# Patient Record
Sex: Female | Born: 1970 | Race: White | Hispanic: No | Marital: Married | Smoking: Never smoker
Health system: Southern US, Community
[De-identification: ages and names within clinical notes are randomized; demographics above are authoritative.]

## PROBLEM LIST (undated history)

## (undated) DIAGNOSIS — F32A Depression, unspecified: Secondary | ICD-10-CM

## (undated) DIAGNOSIS — E785 Hyperlipidemia, unspecified: Secondary | ICD-10-CM

## (undated) DIAGNOSIS — R7303 Prediabetes: Secondary | ICD-10-CM

## (undated) DIAGNOSIS — E049 Nontoxic goiter, unspecified: Secondary | ICD-10-CM

## (undated) DIAGNOSIS — T8859XA Other complications of anesthesia, initial encounter: Secondary | ICD-10-CM

## (undated) DIAGNOSIS — K219 Gastro-esophageal reflux disease without esophagitis: Secondary | ICD-10-CM

## (undated) DIAGNOSIS — C801 Malignant (primary) neoplasm, unspecified: Secondary | ICD-10-CM

## (undated) DIAGNOSIS — F329 Major depressive disorder, single episode, unspecified: Secondary | ICD-10-CM

## (undated) DIAGNOSIS — F419 Anxiety disorder, unspecified: Secondary | ICD-10-CM

## (undated) DIAGNOSIS — I10 Essential (primary) hypertension: Secondary | ICD-10-CM

## (undated) DIAGNOSIS — L570 Actinic keratosis: Secondary | ICD-10-CM

## (undated) DIAGNOSIS — R011 Cardiac murmur, unspecified: Secondary | ICD-10-CM

## (undated) HISTORY — DX: Essential (primary) hypertension: I10

## (undated) HISTORY — PX: COLONOSCOPY: SHX174

## (undated) HISTORY — PX: BREAST CYST ASPIRATION: SHX578

## (undated) HISTORY — PX: TONSILLECTOMY: SUR1361

## (undated) HISTORY — DX: Gastro-esophageal reflux disease without esophagitis: K21.9

## (undated) HISTORY — DX: Depression, unspecified: F32.A

## (undated) HISTORY — DX: Prediabetes: R73.03

## (undated) HISTORY — DX: Actinic keratosis: L57.0

## (undated) HISTORY — PX: NASAL SEPTUM SURGERY: SHX37

## (undated) HISTORY — DX: Major depressive disorder, single episode, unspecified: F32.9

## (undated) HISTORY — DX: Nontoxic goiter, unspecified: E04.9

## (undated) HISTORY — DX: Hyperlipidemia, unspecified: E78.5

## (undated) HISTORY — DX: Anxiety disorder, unspecified: F41.9

## (undated) HISTORY — PX: TONSILLECTOMY: SHX5217

---

## 2005-11-01 ENCOUNTER — Ambulatory Visit: Payer: Self-pay | Admitting: Family Medicine

## 2011-05-02 ENCOUNTER — Ambulatory Visit: Payer: Self-pay | Admitting: Obstetrics and Gynecology

## 2012-05-08 ENCOUNTER — Ambulatory Visit: Payer: Self-pay | Admitting: Obstetrics and Gynecology

## 2013-01-28 ENCOUNTER — Ambulatory Visit: Payer: Self-pay | Admitting: Family Medicine

## 2013-10-29 ENCOUNTER — Ambulatory Visit: Payer: Self-pay

## 2014-02-09 ENCOUNTER — Ambulatory Visit: Payer: Self-pay | Admitting: Family Medicine

## 2015-11-08 ENCOUNTER — Other Ambulatory Visit: Payer: Self-pay | Admitting: Obstetrics and Gynecology

## 2015-11-08 DIAGNOSIS — Z1239 Encounter for other screening for malignant neoplasm of breast: Secondary | ICD-10-CM

## 2016-03-13 ENCOUNTER — Other Ambulatory Visit: Payer: Self-pay | Admitting: Otolaryngology

## 2016-03-13 DIAGNOSIS — E041 Nontoxic single thyroid nodule: Secondary | ICD-10-CM

## 2016-03-15 ENCOUNTER — Ambulatory Visit: Payer: Self-pay

## 2016-06-11 ENCOUNTER — Encounter: Payer: Self-pay | Admitting: Family Medicine

## 2016-06-29 HISTORY — PX: ABDOMINAL HYSTERECTOMY: SHX81

## 2016-07-04 DIAGNOSIS — K219 Gastro-esophageal reflux disease without esophagitis: Secondary | ICD-10-CM | POA: Insufficient documentation

## 2016-07-04 DIAGNOSIS — E785 Hyperlipidemia, unspecified: Secondary | ICD-10-CM | POA: Insufficient documentation

## 2016-07-04 DIAGNOSIS — R7303 Prediabetes: Secondary | ICD-10-CM | POA: Insufficient documentation

## 2016-07-04 DIAGNOSIS — L570 Actinic keratosis: Secondary | ICD-10-CM | POA: Insufficient documentation

## 2016-07-04 DIAGNOSIS — F419 Anxiety disorder, unspecified: Secondary | ICD-10-CM

## 2016-07-04 DIAGNOSIS — F32A Depression, unspecified: Secondary | ICD-10-CM | POA: Insufficient documentation

## 2016-07-04 DIAGNOSIS — E049 Nontoxic goiter, unspecified: Secondary | ICD-10-CM

## 2016-07-04 DIAGNOSIS — I10 Essential (primary) hypertension: Secondary | ICD-10-CM | POA: Insufficient documentation

## 2016-07-04 DIAGNOSIS — F329 Major depressive disorder, single episode, unspecified: Secondary | ICD-10-CM

## 2016-07-04 DIAGNOSIS — E782 Mixed hyperlipidemia: Secondary | ICD-10-CM

## 2016-07-20 ENCOUNTER — Ambulatory Visit: Payer: Self-pay | Admitting: Family Medicine

## 2016-08-29 ENCOUNTER — Encounter: Payer: Self-pay | Admitting: Family Medicine

## 2016-08-29 ENCOUNTER — Ambulatory Visit (INDEPENDENT_AMBULATORY_CARE_PROVIDER_SITE_OTHER): Payer: BC Managed Care – PPO | Admitting: Family Medicine

## 2016-08-29 VITALS — BP 134/77 | HR 107 | Temp 99.6°F | Ht 63.75 in | Wt 228.4 lb

## 2016-08-29 DIAGNOSIS — F330 Major depressive disorder, recurrent, mild: Secondary | ICD-10-CM | POA: Diagnosis not present

## 2016-08-29 DIAGNOSIS — Z7689 Persons encountering health services in other specified circumstances: Secondary | ICD-10-CM | POA: Diagnosis not present

## 2016-08-29 DIAGNOSIS — F419 Anxiety disorder, unspecified: Secondary | ICD-10-CM

## 2016-08-29 DIAGNOSIS — I10 Essential (primary) hypertension: Secondary | ICD-10-CM

## 2016-08-29 DIAGNOSIS — R509 Fever, unspecified: Secondary | ICD-10-CM | POA: Diagnosis not present

## 2016-08-29 LAB — VERITOR FLU A/B WAIVED
INFLUENZA A: NEGATIVE
Influenza B: NEGATIVE

## 2016-08-29 MED ORDER — ESCITALOPRAM OXALATE 20 MG PO TABS: 20.0000 mg | ORAL_TABLET | Freq: Every day | ORAL | 3 refills | Status: DC

## 2016-08-29 MED ORDER — BUSPIRONE HCL 15 MG PO TABS: 15.0000 mg | ORAL_TABLET | Freq: Every day | ORAL | 3 refills | Status: DC

## 2016-08-29 MED ORDER — HYDROCHLOROTHIAZIDE 25 MG PO TABS: 25.0000 mg | ORAL_TABLET | Freq: Every day | ORAL | 1 refills | Status: DC

## 2016-08-29 NOTE — Assessment & Plan Note (Signed)
Stable on lexapro. Continue current regimen

## 2016-08-29 NOTE — Progress Notes (Addendum)
BP 134/77 (BP Location: Left Arm, Patient Position: Sitting, Cuff Size: Normal)   Pulse (!) 107   Temp 99.6 F (37.6 C)   Ht 5' 3.75" (1.619 m)   Wt 228 lb 6.4 oz (103.6 kg)   SpO2 97%   BMI 39.51 kg/m    Subjective:    Patient ID: Paula Clay, female    DOB: 20-Jun-1971, 46 y.o.   MRN: [id number]  HPI: Paula Clay is a 46 y.o. female  Chief Complaint  Patient presents with  . Establish Care   Patient presents to establish care. Is noted to have a low grade fever upon check in, and states she is developing a sore throat and some aches. Has not taken anything for sxs as they have just started coming on in the past hour or so. She is a Education officer, museum, so several sick contacts.   Moods are stable on current regimen of lexapro and buspar. Has tapered back to once daily buspar and seems to be fine on that. Eventually wanting to come off altogether, but states she wants to wait until summer to discuss that further.   BPs doing well with HCTZ. Has had some mild potassium abnormalities in the past so gets electrolytes monitored regularly. Denies CP, SOB, dizziness, syncope, HAs. Taking medication faithfully without side effects.   Past Medical History:  Diagnosis Date  . Actinic keratosis   . Anxiety   . Depression   . GERD (gastroesophageal reflux disease)   . Hyperlipidemia   . Hypertension   . Pre-diabetes   . Thyroid goiter    Social History   Social History  . Marital status: Married    Spouse name: N/A  . Number of children: N/A  . Years of education: N/A   Occupational History  . Not on file.   Social History Main Topics  . Smoking status: Never Smoker  . Smokeless tobacco: Never Used  . Alcohol use No     Comment: Very Rarely  . Drug use: No  . Sexual activity: No   Other Topics Concern  . Not on file   Social History Narrative  . No narrative on file   Depression screen [facility] 2/9 08/29/2016  Decreased Interest 0  Down, Depressed, Hopeless  0  PHQ - 2 Score 0  Altered sleeping 0  Tired, decreased energy 1  Change in appetite 1  Feeling bad or failure about yourself  1  Trouble concentrating 0  Moving slowly or fidgety/restless 0  Suicidal thoughts 0  PHQ-9 Score 3   GAD 7 : Generalized Anxiety Score 08/29/2016  Nervous, Anxious, on Edge 1  Control/stop worrying 0  Worry too much - different things 0  Trouble relaxing 0  Restless 0  Easily annoyed or irritable 1  Afraid - awful might happen 0  Total GAD 7 Score 2  Anxiety Difficulty Not difficult at all     Relevant past medical, surgical, family and social history reviewed and updated as indicated. Interim medical history since our last visit reviewed. Allergies and medications reviewed and updated.  Review of Systems  Constitutional: Positive for diaphoresis and fever.  HENT: Positive for sore throat.   Eyes: Negative.   Respiratory: Negative.   Cardiovascular: Negative.   Gastrointestinal: Negative.   Genitourinary: Negative.   Musculoskeletal: Positive for myalgias.  Neurological: Negative.   Psychiatric/Behavioral: Negative.     Per HPI unless specifically indicated above     Objective:    BP 134/77 (  BP Location: Left Arm, Patient Position: Sitting, Cuff Size: Normal)   Pulse (!) 107   Temp 99.6 F (37.6 C)   Ht 5' 3.75" (1.619 m)   Wt 228 lb 6.4 oz (103.6 kg)   SpO2 97%   BMI 39.51 kg/m   Wt Readings from Last 3 Encounters:  08/29/16 228 lb 6.4 oz (103.6 kg)    Physical Exam  Constitutional: She is oriented to person, place, and time. She appears well-developed and well-nourished. No distress.  HENT:  Head: Atraumatic.  Eyes: Conjunctivae are normal. Pupils are equal, round, and reactive to light. No scleral icterus.  Neck: Normal range of motion. Neck supple. No thyromegaly present.  Cardiovascular: Normal rate and normal heart sounds.   Pulmonary/Chest: Effort normal and breath sounds normal. No respiratory distress.    Musculoskeletal: Normal range of motion.  Neurological: She is alert and oriented to person, place, and time.  Skin: Skin is warm and dry.  Psychiatric: She has a normal mood and affect. Her behavior is normal.  Nursing note and vitals reviewed.   No results found for this or any previous visit.    Assessment & Plan:   Problem List Items Addressed This Visit      Cardiovascular and Mediastinum   Hypertension    Stable on 25 mg HCTZ. Check BMP today, follow up in 6 months.       Relevant Medications   hydrochlorothiazide (HYDRODIURIL) 25 MG tablet   Other Relevant Orders   Basic Metabolic Panel (BMET)     Other   Anxiety    Stable, has been able to cut back to 15 mg buspar once daily. Interested in eventually coming off altogether but for now will continue this regimen      Relevant Medications   escitalopram (LEXAPRO) 20 MG tablet   busPIRone (BUSPAR) 15 MG tablet   Depression    Stable on lexapro. Continue current regimen      Relevant Medications   escitalopram (LEXAPRO) 20 MG tablet   busPIRone (BUSPAR) 15 MG tablet    Other Visit Diagnoses    Encounter to establish care    -  Primary   Fever, unspecified fever cause       Rapid flu negative. Discussed with patient to follow up tomorrow by phone or mychart if symptoms persist or worsen. Tylenol and ibuprofen prn   Relevant Orders   Veritor Flu A/B Waived       Follow up plan: Return in about 6 months (around 02/26/2017) for Physical Exam.

## 2016-08-29 NOTE — Assessment & Plan Note (Signed)
Stable on 25 mg HCTZ. Check BMP today, follow up in 6 months.

## 2016-08-29 NOTE — Patient Instructions (Signed)
Follow up in about 6 months for your Physical exam

## 2016-08-29 NOTE — Assessment & Plan Note (Signed)
Stable, has been able to cut back to 15 mg buspar once daily. Interested in eventually coming off altogether but for now will continue this regimen

## 2016-08-30 LAB — BASIC METABOLIC PANEL
BUN/Creatinine Ratio: 22 (ref 9–23)
BUN: 17 mg/dL (ref 6–24)
CHLORIDE: 97 mmol/L (ref 96–106)
CO2: 22 mmol/L (ref 18–29)
Calcium: 9.4 mg/dL (ref 8.7–10.2)
Creatinine, Ser: 0.78 mg/dL (ref 0.57–1.00)
GFR calc Af Amer: 105 mL/min/{1.73_m2} (ref >59)
GFR, EST NON AFRICAN AMERICAN: 91 mL/min/{1.73_m2} (ref >59)
GLUCOSE: 97 mg/dL (ref 65–99)
POTASSIUM: 4 mmol/L (ref 3.5–5.2)
SODIUM: 140 mmol/L (ref 134–144)

## 2016-08-31 ENCOUNTER — Encounter: Payer: Self-pay | Admitting: Family Medicine

## 2016-11-16 ENCOUNTER — Ambulatory Visit (INDEPENDENT_AMBULATORY_CARE_PROVIDER_SITE_OTHER): Payer: BC Managed Care – PPO | Admitting: Family Medicine

## 2016-11-16 ENCOUNTER — Encounter: Payer: Self-pay | Admitting: Family Medicine

## 2016-11-16 ENCOUNTER — Telehealth: Payer: Self-pay | Admitting: Family Medicine

## 2016-11-16 VITALS — BP 113/79 | HR 86 | Temp 98.4°F | Wt 218.6 lb

## 2016-11-16 DIAGNOSIS — M62838 Other muscle spasm: Secondary | ICD-10-CM | POA: Diagnosis not present

## 2016-11-16 DIAGNOSIS — R079 Chest pain, unspecified: Secondary | ICD-10-CM | POA: Diagnosis not present

## 2016-11-16 DIAGNOSIS — H93A1 Pulsatile tinnitus, right ear: Secondary | ICD-10-CM | POA: Diagnosis not present

## 2016-11-16 DIAGNOSIS — R202 Paresthesia of skin: Secondary | ICD-10-CM

## 2016-11-16 MED ORDER — NAPROXEN 500 MG PO TABS: 500.0000 mg | ORAL_TABLET | Freq: Two times a day (BID) | ORAL | 0 refills | Status: DC

## 2016-11-16 MED ORDER — CYCLOBENZAPRINE HCL 10 MG PO TABS: 10.0000 mg | ORAL_TABLET | Freq: Every day | ORAL | 0 refills | Status: DC

## 2016-11-16 NOTE — Telephone Encounter (Signed)
Appointment scheduled.

## 2016-11-16 NOTE — Telephone Encounter (Signed)
Does she need to go to the ER

## 2016-11-16 NOTE — Telephone Encounter (Signed)
Pt called and stated she hears her heartbeat in R ear, L breast feels as if its vibrating, neck pain on R side at the base of her neck and her fingers on R hand are tingling and she her head hurts on the R side. She would like know what she should do.

## 2016-11-16 NOTE — Progress Notes (Signed)
BP 113/79 (BP Location: Right Arm, Patient Position: Sitting, Cuff Size: Large)   Pulse 86   Temp 98.4 F (36.9 C)   Wt 218 lb 9.6 oz (99.2 kg)   SpO2 96%   BMI 37.82 kg/m    Subjective:    Patient ID: Paula Clay, female    DOB: 1971/02/23, 46 y.o.   MRN: [id number]  HPI: Paula Clay is a 46 y.o. female  Chief Complaint  Patient presents with  . Chest Pain   She notes that her neck has been bothering her for several months- but has now gotten to the point that it was painful. Has a headache in the back of her head and her hands have been tingling. She notes that she can hear her heart beat in her R ear.  CHEST DISCOMFORT- vibrating of L breast, started 2 days ago, but hasn't happened today, not painful Time since onset: 3 days ago Duration: Came and went, lasted a couple of seconds, about every 20 minutes for 2 days straight Onset: sudden Quality: vibrating Severity: unsure how to rate it Location: In the L breast tissue Radiation: none Episode duration: couple of seconds Frequency: intermittent Related to exertion: no Activity when pain started: unknown Trauma: no Anxiety/recent stressors: no Aggravating factors: nothing Alleviating factors: nothing Status: better Treatments attempted: nothing  Current pain status: pain free Shortness of breath: no Cough: no Nausea: no Diaphoresis: yes- night sweats Heartburn: no Palpitations: no  NECK PAIN  Status: uncontrolled Treatments attempted: nothing   Relief with NSAIDs?:  No NSAIDs Taken Location:Right Duration:Several months, signifcantly worse for the last month or so Severity: severe Quality: sharp, stabbing Frequency: constant Radiation: headache Aggravating factors: looking towards it and looking down Alleviating factors: nothing Weakness:  no Paresthesias / decreased sensation:  yes  Fevers:  no   Relevant past medical, surgical, family and social history reviewed and updated as  indicated. Interim medical history since our last visit reviewed. Allergies and medications reviewed and updated.  Review of Systems  Constitutional: Negative.   HENT: Negative.   Respiratory: Negative.   Cardiovascular: Negative.   Musculoskeletal: Positive for myalgias, neck pain and neck stiffness. Negative for arthralgias, back pain, gait problem and joint swelling.  Neurological: Positive for numbness. Negative for dizziness, tremors, seizures, syncope, facial asymmetry, speech difficulty, weakness, light-headedness and headaches.  Psychiatric/Behavioral: Negative.     Per HPI unless specifically indicated above     Objective:    BP 113/79 (BP Location: Right Arm, Patient Position: Sitting, Cuff Size: Large)   Pulse 86   Temp 98.4 F (36.9 C)   Wt 218 lb 9.6 oz (99.2 kg)   SpO2 96%   BMI 37.82 kg/m   Wt Readings from Last 3 Encounters:  11/16/16 218 lb 9.6 oz (99.2 kg)  08/29/16 228 lb 6.4 oz (103.6 kg)    Orthostatic VS for the past 24 hrs:  BP- Lying Pulse- Lying BP- Sitting Pulse- Sitting BP- Standing at 0 minutes Pulse- Standing at 0 minutes  11/16/16 1447 105/69 71 112/76 80 127/85 96   Physical Exam  Constitutional: She is oriented to person, place, and time. She appears well-developed and well-nourished. No distress.  HENT:  Head: Normocephalic and atraumatic.  Right Ear: Hearing normal.  Left Ear: Hearing normal.  Nose: Nose normal.  Eyes: Conjunctivae and lids are normal. Right eye exhibits no discharge. Left eye exhibits no discharge. No scleral icterus.  Neck: Neck supple. No JVD present. No tracheal deviation present.  No thyromegaly present.  No bruits bilaterally  Cardiovascular: Normal rate, regular rhythm, normal heart sounds and intact distal pulses.  Exam reveals no friction rub.   No murmur heard. Pulmonary/Chest: Effort normal and breath sounds normal. No respiratory distress. She has no wheezes. She has no rales. She exhibits no tenderness.    Lymphadenopathy:    She has no cervical adenopathy.  Neurological: She is alert and oriented to person, place, and time.  Skin: Skin is warm, dry and intact. No rash noted. She is not diaphoretic. No erythema. No pallor.  Psychiatric: She has a normal mood and affect. Her speech is normal and behavior is normal. Judgment and thought content normal. Cognition and memory are normal.  Nursing note and vitals reviewed. Neck Exam:    Tenderness to Palpation: yes    Midline cervical spine: no    Paraspinal neck musculature: yes    Trapezius: yes    Sternocleidomastoid: yes     Range of Motion:     Flexion: Decreased    Extension: Decreased    Lateral rotation: Decreased    Lateral bending: Decreased     Neuro Examination: Upper extremity DTRs normal & symmetric.  Strength and sensation intact.    Special Tests:     Spurling test: negative   Results for orders placed or performed in visit on 08/29/16  Veritor Flu A/B Waived  Result Value Ref Range   Influenza A Negative Negative   Influenza B Negative Negative  Basic Metabolic Panel (BMET)  Result Value Ref Range   Glucose 97 65 - 99 mg/dL   BUN 17 6 - 24 mg/dL   Creatinine, Ser 0.78 0.57 - 1.00 mg/dL   GFR calc non Af Amer 91 >59 mL/min/1.73   GFR calc Af Amer 105 >59 mL/min/1.73   BUN/Creatinine Ratio 22 9 - 23   Sodium 140 134 - 144 mmol/L   Potassium 4.0 3.5 - 5.2 mmol/L   Chloride 97 96 - 106 mmol/L   CO2 22 18 - 29 mmol/L   Calcium 9.4 8.7 - 10.2 mg/dL      Assessment & Plan:   Problem List Items Addressed This Visit    None    Visit Diagnoses    Chest pain, unspecified type    -  Primary   Sounds more like breast discomfort, some flipped t-waves, will compare to previous EKG, offered referral to cardiology for stress test she will hold off for now   Relevant Orders   EKG 12-Lead (Completed)   Pulsatile tinnitus of right ear       No bruits. ?Due to SCM spasm. BP and orthostatics normal. Will start flexeril and  follow up on Monday by phone.    Muscle spasms of neck       Significant spasm and tenderness. Will start muscle relaxer and naproxen. Stretches given. Follow up on Monday by phone.    Paresthesias       Likely due to nerve irritation. Will treat muscle spasm and recheck by phone on Monday.       Follow up plan: Return By Phone On Monday, for Records release for [facility] for EKG please.

## 2016-11-16 NOTE — Telephone Encounter (Signed)
If we have anything or Paula Clay does she can come in (she likely needs her BP checked and and EKG) but if we don't, or she doesn't want to wait, she should go to the ER

## 2016-11-19 ENCOUNTER — Telehealth: Payer: Self-pay | Admitting: Family Medicine

## 2016-11-19 DIAGNOSIS — R9431 Abnormal electrocardiogram [ECG] [EKG]: Secondary | ICD-10-CM

## 2016-11-19 DIAGNOSIS — R42 Dizziness and giddiness: Secondary | ICD-10-CM

## 2016-11-19 DIAGNOSIS — R079 Chest pain, unspecified: Secondary | ICD-10-CM

## 2016-11-19 NOTE — Telephone Encounter (Signed)
Sent to [facility].

## 2016-11-19 NOTE — Telephone Encounter (Signed)
-----   Message from Valerie Roys, DO sent at 11/16/2016  3:33 PM EDT ----- Call about symptoms

## 2016-11-19 NOTE — Telephone Encounter (Signed)
She notes that she is still having the pulsatile tinnitus in her ear, has continued with the paresthesias in her hands. Has continued with the "vibrating" of her L breast and now is starting to feel dizzy, like she is going to pass out. Naproxen not particularly helping. Flexeril did seem to help a bit.  Still distinctly possible that this is due to the hypertonic muscles in her neck, however, we have not gotten the EKG tracing from [facility] yet to compare, and now with the dizzy spells, we will get her into cardiology ASAP for evaluation. She may need a stress test. Urgent referral generated today.   Glenetta Hew-- Urgent referral.

## 2016-11-20 ENCOUNTER — Telehealth: Payer: Self-pay

## 2016-11-20 NOTE — Telephone Encounter (Signed)
Patient offered new patient appt on 11/20/16 and 12/15/16 . She could not take Thursday or morning .  Scheduled patient for 5/10 at 220

## 2016-12-11 ENCOUNTER — Ambulatory Visit: Payer: Self-pay | Admitting: Cardiology

## 2017-01-22 ENCOUNTER — Other Ambulatory Visit: Payer: Self-pay | Admitting: Family Medicine

## 2017-01-22 DIAGNOSIS — Z1231 Encounter for screening mammogram for malignant neoplasm of breast: Secondary | ICD-10-CM

## 2017-02-05 NOTE — Telephone Encounter (Signed)
4 attempts made to schedule new patient appt per referral.  Called patient today no ans no vm.  Mailed letter .  Removed from wq.

## 2017-02-13 ENCOUNTER — Ambulatory Visit: Payer: Self-pay

## 2017-02-14 ENCOUNTER — Ambulatory Visit
Admission: RE | Admit: 2017-02-14 | Discharge: 2017-02-14 | Disposition: A | Discharge status: Home / Self Care | Payer: BC Managed Care – PPO | Source: Ambulatory Visit | Attending: Family Medicine | Admitting: Family Medicine

## 2017-02-14 DIAGNOSIS — Z1231 Encounter for screening mammogram for malignant neoplasm of breast: Secondary | ICD-10-CM

## 2017-02-14 HISTORY — DX: Malignant (primary) neoplasm, unspecified: C80.1

## 2017-02-18 ENCOUNTER — Ambulatory Visit (INDEPENDENT_AMBULATORY_CARE_PROVIDER_SITE_OTHER): Payer: BC Managed Care – PPO | Admitting: Family Medicine

## 2017-02-18 ENCOUNTER — Encounter: Payer: Self-pay | Admitting: Family Medicine

## 2017-02-18 VITALS — BP 98/65 | HR 81 | Temp 98.4°F | Ht 63.0 in | Wt 207.0 lb

## 2017-02-18 DIAGNOSIS — R7303 Prediabetes: Secondary | ICD-10-CM

## 2017-02-18 DIAGNOSIS — F330 Major depressive disorder, recurrent, mild: Secondary | ICD-10-CM

## 2017-02-18 DIAGNOSIS — I10 Essential (primary) hypertension: Secondary | ICD-10-CM | POA: Diagnosis not present

## 2017-02-18 DIAGNOSIS — M25552 Pain in left hip: Secondary | ICD-10-CM

## 2017-02-18 DIAGNOSIS — K219 Gastro-esophageal reflux disease without esophagitis: Secondary | ICD-10-CM | POA: Diagnosis not present

## 2017-02-18 DIAGNOSIS — Z0000 Encounter for general adult medical examination without abnormal findings: Secondary | ICD-10-CM | POA: Diagnosis not present

## 2017-02-18 DIAGNOSIS — E049 Nontoxic goiter, unspecified: Secondary | ICD-10-CM

## 2017-02-18 DIAGNOSIS — F419 Anxiety disorder, unspecified: Secondary | ICD-10-CM

## 2017-02-18 DIAGNOSIS — E782 Mixed hyperlipidemia: Secondary | ICD-10-CM | POA: Diagnosis not present

## 2017-02-18 DIAGNOSIS — M25551 Pain in right hip: Secondary | ICD-10-CM

## 2017-02-18 MED ORDER — ESCITALOPRAM OXALATE 20 MG PO TABS: 20.0000 mg | ORAL_TABLET | Freq: Every day | ORAL | 1 refills | Status: DC

## 2017-02-18 MED ORDER — BUSPIRONE HCL 15 MG PO TABS: 15.0000 mg | ORAL_TABLET | Freq: Every day | ORAL | 1 refills | Status: DC

## 2017-02-18 NOTE — Assessment & Plan Note (Signed)
Stable. She has not noticed any change in size. Will check labs today. Continue to monitor. Call with any concerns.

## 2017-02-18 NOTE — Assessment & Plan Note (Signed)
Running low with weight loss. Will stop HCTZ and recheck in 4-6 weeks. Call with any concerns.

## 2017-02-18 NOTE — Assessment & Plan Note (Signed)
Doing well. Will hold on cutting down right now due to father's illness. Once things stablize, will start cutting down.

## 2017-02-18 NOTE — Progress Notes (Signed)
BP 98/65 (BP Location: Left Arm, Patient Position: Sitting, Cuff Size: Large)   Pulse 81   Temp 98.4 F (36.9 C)   Ht 5\' 3"  (1.6 m)   Wt 207 lb (93.9 kg)   SpO2 94%   BMI 36.67 kg/m    Subjective:    Patient ID: Paula Clay, female    DOB: [birth year], 46 y.o.   MRN: [id number]  HPI: Paula Clay is a 46 y.o. female presenting on 02/18/2017 for comprehensive medical examination. Current medical complaints include:  HYPERTENSION / HYPERLIPIDEMIA Satisfied with current treatment? no Duration of hypertension: chronic BP monitoring frequency: not checking BP medication side effects: yes- has been getting  Past BP meds: HCTZ Duration of hyperlipidemia: chronic Cholesterol medication side effects: Not on anything Cholesterol supplements: none Past cholesterol medications: none Medication compliance: excellent compliance Aspirin: no Recent stressors: no Recurrent headaches: no Visual changes: no Palpitations: no Dyspnea: no Chest pain: no Lower extremity edema: no Dizzy/lightheaded: yes  Impaired Fasting Glucose Duration of elevated blood sugar: chronic Polydipsia: no Polyuria: no Weight change: yes Visual disturbance: no Glucose Monitoring: yes    Accucheck frequency: Not Checking   DEPRESSION- dad has been very sick, currently being treated for bladder cancer and being worked up for brain tumor Mood status: stable Satisfied with current treatment?: yes Symptom severity: mild  Duration of current treatment : chronic Side effects: no Medication compliance: excellent compliance Psychotherapy/counseling: no  Previous psychiatric medications: effexor, buspar Depressed mood: no Anxious mood: yes Anhedonia: no Significant weight loss or gain: yes- with effort Insomnia: no  Fatigue: yes Feelings of worthlessness or guilt: no Impaired concentration/indecisiveness: no Suicidal ideations: no Hopelessness: no Crying spells: no Depression screen [facility] 2/9  02/18/2017 08/29/2016  Decreased Interest 0 0  Down, Depressed, Hopeless 1 0  PHQ - 2 Score 1 0  Altered sleeping 0 0  Tired, decreased energy 1 1  Change in appetite 0 1  Feeling bad or failure about yourself  0 1  Trouble concentrating 0 0  Moving slowly or fidgety/restless 0 0  Suicidal thoughts 0 0  PHQ-9 Score 2 3   GAD 7 : Generalized Anxiety Score 02/18/2017 08/29/2016  Nervous, Anxious, on Edge 1 1  Control/stop worrying 0 0  Worry too much - different things 0 0  Trouble relaxing - 0  Restless 0 0  Easily annoyed or irritable 1 1  Afraid - awful might happen 1 0  Total GAD 7 Score - 2  Anxiety Difficulty Not difficult at all Not difficult at all   GERD GERD control status: controlled  Satisfied with current treatment? yes Heartburn frequency: none Dysphagia: no Odynophagia:  no Hematemesis: no Blood in stool: no EGD: no  GOITER Thyroid control status:controlled Satisfied with current treatment? yes Medication side effects: Not on anything Fatigue: no Cold intolerance: no Heat intolerance: no Weight gain: no Weight loss: no Constipation: yes Diarrhea/loose stools: no Palpitations: no Lower extremity edema: no Anxiety/depressed mood: no  Menopausal Symptoms: yes- starting with hot flashes  Depression Screen done today and results listed below:  Depression screen [name] 2/9 02/18/2017 08/29/2016  Decreased Interest 0 0  Down, Depressed, Hopeless 1 0  PHQ - 2 Score 1 0  Altered sleeping 0 0  Tired, decreased energy 1 1  Change in appetite 0 1  Feeling bad or failure about yourself  0 1  Trouble concentrating 0 0  Moving slowly or fidgety/restless 0 0  Suicidal thoughts 0 0  PHQ-9 Score  2 3    Past Medical History:  Past Medical History:  Diagnosis Date  . Actinic keratosis   . Anxiety   . Cancer ([place])    skin ca  . Depression   . GERD (gastroesophageal reflux disease)   . GERD (gastroesophageal reflux disease)   . Hyperlipidemia   .  Hypertension   . Pre-diabetes   . Thyroid goiter     Surgical History:  Past Surgical History:  Procedure Laterality Date  . ABDOMINAL HYSTERECTOMY  06/2016   total  . BREAST CYST ASPIRATION     ? side-neg  . NASAL SEPTUM SURGERY    . TONSILLECTOMY      Medications:  No current outpatient prescriptions on file prior to visit.   No current facility-administered medications on file prior to visit.     Allergies:  Allergies  Allergen Reactions  . Lisinopril Cough    Social History:  Social History   Social History  . Marital status: Married    Spouse name: N/A  . Number of children: N/A  . Years of education: N/A   Occupational History  . Not on file.   Social History Main Topics  . Smoking status: Never Smoker  . Smokeless tobacco: Never Used  . Alcohol use No     Comment: Very Rarely  . Drug use: No  . Sexual activity: No   Other Topics Concern  . Not on file   Social History Narrative  . No narrative on file   History  Smoking Status  . Never Smoker  Smokeless Tobacco  . Never Used   History  Alcohol Use No    Comment: Very Rarely    Family History:  Family History  Problem Relation Age of Onset  . Diabetes Mother   . Hypertension Mother   . Bipolar disorder Mother   . Diabetes Father   . Cancer Father        bladder  . Renal Cyst Father   . Pulmonary Hypertension Father   . Thyroid disease Sister   . Hypertension Maternal Grandmother   . Thyroid disease Maternal Grandmother   . Glaucoma Paternal Grandmother   . Osteoporosis Paternal Grandmother   . Breast cancer Neg Hx     Past medical history, surgical history, medications, allergies, family history and social history reviewed with patient today and changes made to appropriate areas of the chart.   Review of Systems  Constitutional: Positive for diaphoresis. Negative for chills, fever, malaise/fatigue and weight loss.  HENT: Negative.   Eyes: Negative.   Respiratory:  Negative.   Cardiovascular: Negative.   Gastrointestinal: Positive for constipation. Negative for abdominal pain, blood in stool, diarrhea, heartburn, melena, nausea and vomiting.  Genitourinary: Negative.   Musculoskeletal: Positive for joint pain (hip pain when she is sitting for an extended period of time). Negative for back pain, falls, myalgias and neck pain.  Skin: Negative.   Neurological: Positive for dizziness and tingling (in her hands, goes away with shaking). Negative for tremors, sensory change, speech change, focal weakness, seizures, loss of consciousness, weakness and headaches.  Endo/Heme/Allergies: Negative.   Psychiatric/Behavioral: Negative for depression, hallucinations, memory loss, substance abuse and suicidal ideas. The patient is nervous/anxious. The patient does not have insomnia.     All other ROS negative except what is listed above and in the HPI.      Objective:    BP 98/65 (BP Location: Left Arm, Patient Position: Sitting, Cuff Size: Large)   Pulse 81  Temp 98.4 F (36.9 C)   Ht 5\' 3"  (1.6 m)   Wt 207 lb (93.9 kg)   SpO2 94%   BMI 36.67 kg/m   Wt Readings from Last 3 Encounters:  02/18/17 207 lb (93.9 kg)  11/16/16 218 lb 9.6 oz (99.2 kg)  08/29/16 228 lb 6.4 oz (103.6 kg)    Physical Exam  Constitutional: She is oriented to person, place, and time. She appears well-developed and well-nourished. No distress.  HENT:  Head: Normocephalic and atraumatic.  Right Ear: Hearing, tympanic membrane, external ear and ear canal normal.  Left Ear: Hearing, tympanic membrane, external ear and ear canal normal.  Nose: Nose normal.  Mouth/Throat: Uvula is midline, oropharynx is clear and moist and mucous membranes are normal. No oropharyngeal exudate.  Eyes: Pupils are equal, round, and reactive to light. Conjunctivae, EOM and lids are normal. Right eye exhibits no discharge. Left eye exhibits no discharge. No scleral icterus.  Neck: Normal range of motion.  Neck supple. No JVD present. No tracheal deviation present. Thyromegaly present.  Cardiovascular: Normal rate, regular rhythm, normal heart sounds and intact distal pulses.  Exam reveals no gallop and no friction rub.   No murmur heard. Pulmonary/Chest: Effort normal and breath sounds normal. No stridor. No respiratory distress. She has no wheezes. She has no rales. She exhibits no tenderness.  Abdominal: Soft. Bowel sounds are normal. She exhibits no distension and no mass. There is no tenderness. There is no rebound and no guarding.  Genitourinary:  Genitourinary Comments: Breast and pelvic exams deferred- done at GYN  Musculoskeletal: Normal range of motion. She exhibits no edema, tenderness or deformity.  Lymphadenopathy:    She has no cervical adenopathy.  Neurological: She is alert and oriented to person, place, and time. She has normal reflexes. She displays normal reflexes. No cranial nerve deficit. She exhibits normal muscle tone. Coordination normal.  Skin: Skin is warm, dry and intact. No rash noted. She is not diaphoretic. No erythema. No pallor.  Psychiatric: She has a normal mood and affect. Her speech is normal and behavior is normal. Judgment and thought content normal. Cognition and memory are normal.    Results for orders placed or performed in visit on 08/29/16  Veritor Flu A/B Waived  Result Value Ref Range   Influenza A Negative Negative   Influenza B Negative Negative  Basic Metabolic Panel (BMET)  Result Value Ref Range   Glucose 97 65 - 99 mg/dL   BUN 17 6 - 24 mg/dL   Creatinine, Ser 0.78 0.57 - 1.00 mg/dL   GFR calc non Af Amer 91 >59 mL/min/1.73   GFR calc Af Amer 105 >59 mL/min/1.73   BUN/Creatinine Ratio 22 9 - 23   Sodium 140 134 - 144 mmol/L   Potassium 4.0 3.5 - 5.2 mmol/L   Chloride 97 96 - 106 mmol/L   CO2 22 18 - 29 mmol/L   Calcium 9.4 8.7 - 10.2 mg/dL      Assessment & Plan:   Problem List Items Addressed This Visit      Cardiovascular and  Mediastinum   Hypertension    Running low with weight loss. Will stop HCTZ and recheck in 4-6 weeks. Call with any concerns.       Relevant Orders   Comprehensive metabolic panel   Microalbumin, Urine Waived   UA/M w/rflx Culture, Routine     Digestive   RESOLVED: GERD (gastroesophageal reflux disease)    Resolved! No issues. Not on medicine. Feeling  well!      Relevant Orders   CBC with Differential/Platelet   Comprehensive metabolic panel   UA/M w/rflx Culture, Routine     Endocrine   Thyroid goiter    Stable. She has not noticed any change in size. Will check labs today. Continue to monitor. Call with any concerns.       Relevant Orders   Comprehensive metabolic panel   TSH   UA/M w/rflx Culture, Routine     Other   Hyperlipidemia    Rechecking levels today. Treat as needed. Call with any concerns. Continue diet and exercise. Currently on keto.      Relevant Orders   Comprehensive metabolic panel   Lipid Panel w/o Chol/HDL Ratio   UA/M w/rflx Culture, Routine   Pre-diabetes    Rechecking A1c today. Await results. Continue diet and exercise. Call with any concerns.       Relevant Orders   Bayer DCA Hb A1c Waived   Comprehensive metabolic panel   Microalbumin, Urine Waived   UA/M w/rflx Culture, Routine   Anxiety    Doing well. Will hold on cutting down right now due to father's illness. Once things stablize, will start cutting down.       Relevant Medications   busPIRone (BUSPAR) 15 MG tablet   escitalopram (LEXAPRO) 20 MG tablet   Other Relevant Orders   Comprehensive metabolic panel   UA/M w/rflx Culture, Routine   Depression    Doing well. Will hold on cutting down right now due to father's illness. Once things stablize, will start cutting down.       Relevant Medications   busPIRone (BUSPAR) 15 MG tablet   escitalopram (LEXAPRO) 20 MG tablet   Other Relevant Orders   Comprehensive metabolic panel   UA/M w/rflx Culture, Routine    Other Visit  Diagnoses    Routine general medical examination at a health care facility    -  Primary   Vaccines declined. Screening labs checked today. Pap N/A, mammogram up to date. Continue diet and exercise. Call with any concerns.    Relevant Orders   Bayer DCA Hb A1c Waived   CBC with Differential/Platelet   Comprehensive metabolic panel   Lipid Panel w/o Chol/HDL Ratio   Microalbumin, Urine Waived   TSH   UA/M w/rflx Culture, Routine   Pain of both hip joints       Stretches given today. Call with any concerns. Continue to monitor.        Follow up plan: Return 4-6 weeks, for follow up BP.   LABORATORY TESTING:  - Pap smear: not applicable  IMMUNIZATIONS:   - Tdap: Tetanus vaccination status reviewed: Declined. - Influenza: Postponed to flu season - Pneumovax: Not applicable  SCREENING: -Mammogram: Up to date  - Colonoscopy: Not applicable   PATIENT COUNSELING:   Advised to take 1 mg of folate supplement per day if capable of pregnancy.   Sexuality: Discussed sexually transmitted diseases, partner selection, use of condoms, avoidance of unintended pregnancy  and contraceptive alternatives.   Advised to avoid cigarette smoking.  I discussed with the patient that most people either abstain from alcohol or drink within safe limits (<=14/week and <=4 drinks/occasion for males, <=7/weeks and <= 3 drinks/occasion for females) and that the risk for alcohol disorders and other health effects rises proportionally with the number of drinks per week and how often a drinker exceeds daily limits.  Discussed cessation/primary prevention of drug use and availability of treatment for abuse.  Diet: Encouraged to adjust caloric intake to maintain  or achieve ideal body weight, to reduce intake of dietary saturated fat and total fat, to limit sodium intake by avoiding high sodium foods and not adding table salt, and to maintain adequate dietary potassium and calcium preferably from fresh fruits,  vegetables, and low-fat dairy products.    stressed the importance of regular exercise  Injury prevention: Discussed safety belts, safety helmets, smoke detector, smoking near bedding or upholstery.   Dental health: Discussed importance of regular tooth brushing, flossing, and dental visits.    NEXT PREVENTATIVE PHYSICAL DUE IN 1 YEAR. Return 4-6 weeks, for follow up BP.

## 2017-02-18 NOTE — Patient Instructions (Addendum)
Ideal body weigh 107-145lbs  Health Maintenance, Female Adopting a healthy lifestyle and getting preventive care can go a long way to promote health and wellness. Talk with your health care provider about what schedule of regular examinations is right for you. This is a good chance for you to check in with your provider about disease prevention and staying healthy. In between checkups, there are plenty of things you can do on your own. Experts have done a lot of research about which lifestyle changes and preventive measures are most likely to keep you healthy. Ask your health care provider for more information. Weight and diet Eat a healthy diet  Be sure to include plenty of vegetables, fruits, low-fat dairy products, and lean protein.  Do not eat a lot of foods high in solid fats, added sugars, or salt.  Get regular exercise. This is one of the most important things you can do for your health. ? Most adults should exercise for at least 150 minutes each week. The exercise should increase your heart rate and make you sweat (moderate-intensity exercise). ? Most adults should also do strengthening exercises at least twice a week. This is in addition to the moderate-intensity exercise.  Maintain a healthy weight  Body mass index (BMI) is a measurement that can be used to identify possible weight problems. It estimates body fat based on height and weight. Your health care provider can help determine your BMI and help you achieve or maintain a healthy weight.  For females 66 years of age and older: ? A BMI below 18.5 is considered underweight. ? A BMI of 18.5 to 24.9 is normal. ? A BMI of 25 to 29.9 is considered overweight. ? A BMI of 30 and above is considered obese.  Watch levels of cholesterol and blood lipids  You should start having your blood tested for lipids and cholesterol at 47 years of age, then have this test every 5 years.  You may need to have your cholesterol levels checked  more often if: ? Your lipid or cholesterol levels are high. ? You are older than 47 years of age. ? You are at high risk for heart disease.  Cancer screening Lung Cancer  Lung cancer screening is recommended for adults 20-56 years old who are at high risk for lung cancer because of a history of smoking.  A yearly low-dose CT scan of the lungs is recommended for people who: ? Currently smoke. ? Have quit within the past 15 years. ? Have at least a 30-pack-year history of smoking. A pack year is smoking an average of one pack of cigarettes a day for 1 year.  Yearly screening should continue until it has been 15 years since you quit.  Yearly screening should stop if you develop a health problem that would prevent you from having lung cancer treatment.  Breast Cancer  Practice breast self-awareness. This means understanding how your breasts normally appear and feel.  It also means doing regular breast self-exams. Let your health care provider know about any changes, no matter how small.  If you are in your 20s or 30s, you should have a clinical breast exam (CBE) by a health care provider every 1-3 years as part of a regular health exam.  If you are 58 or older, have a CBE every year. Also consider having a breast X-ray (mammogram) every year.  If you have a family history of breast cancer, talk to your health care provider about genetic screening.  If  you are at high risk for breast cancer, talk to your health care provider about having an MRI and a mammogram every year.  Breast cancer gene (BRCA) assessment is recommended for women who have family members with BRCA-related cancers. BRCA-related cancers include: ? Breast. ? Ovarian. ? Tubal. ? Peritoneal cancers.  Results of the assessment will determine the need for genetic counseling and BRCA1 and BRCA2 testing.  Cervical Cancer Your health care provider may recommend that you be screened regularly for cancer of the pelvic  organs (ovaries, uterus, and vagina). This screening involves a pelvic examination, including checking for microscopic changes to the surface of your cervix (Pap test). You may be encouraged to have this screening done every 3 years, beginning at age 33.  For women ages 75-65, health care providers may recommend pelvic exams and Pap testing every 3 years, or they may recommend the Pap and pelvic exam, combined with testing for human papilloma virus (HPV), every 5 years. Some types of HPV increase your risk of cervical cancer. Testing for HPV may also be done on women of any age with unclear Pap test results.  Other health care providers may not recommend any screening for nonpregnant women who are considered low risk for pelvic cancer and who do not have symptoms. Ask your health care provider if a screening pelvic exam is right for you.  If you have had past treatment for cervical cancer or a condition that could lead to cancer, you need Pap tests and screening for cancer for at least 20 years after your treatment. If Pap tests have been discontinued, your risk factors (such as having a new sexual partner) need to be reassessed to determine if screening should resume. Some women have medical problems that increase the chance of getting cervical cancer. In these cases, your health care provider may recommend more frequent screening and Pap tests.  Colorectal Cancer  This type of cancer can be detected and often prevented.  Routine colorectal cancer screening usually begins at 47 years of age and continues through 47 years of age.  Your health care provider may recommend screening at an earlier age if you have risk factors for colon cancer.  Your health care provider may also recommend using home test kits to check for hidden blood in the stool.  A small camera at the end of a tube can be used to examine your colon directly (sigmoidoscopy or colonoscopy). This is done to check for the earliest forms  of colorectal cancer.  Routine screening usually begins at age 55.  Direct examination of the colon should be repeated every 5-10 years through 47 years of age. However, you may need to be screened more often if early forms of precancerous polyps or small growths are found.  Skin Cancer  Check your skin from head to toe regularly.  Tell your health care provider about any new moles or changes in moles, especially if there is a change in a mole's shape or color.  Also tell your health care provider if you have a mole that is larger than the size of a pencil eraser.  Always use sunscreen. Apply sunscreen liberally and repeatedly throughout the day.  Protect yourself by wearing long sleeves, pants, a wide-brimmed hat, and sunglasses whenever you are outside.  Heart disease, diabetes, and high blood pressure  High blood pressure causes heart disease and increases the risk of stroke. High blood pressure is more likely to develop in: ? People who have blood pressure  in the high end of the normal range (130-139/85-89 mm Hg). ? People who are overweight or obese. ? People who are African American.  If you are 39-91 years of age, have your blood pressure checked every 3-5 years. If you are 45 years of age or older, have your blood pressure checked every year. You should have your blood pressure measured twice-once when you are at a hospital or clinic, and once when you are not at a hospital or clinic. Record the average of the two measurements. To check your blood pressure when you are not at a hospital or clinic, you can use: ? An automated blood pressure machine at a pharmacy. ? A home blood pressure monitor.  If you are between 43 years and 46 years old, ask your health care provider if you should take aspirin to prevent strokes.  Have regular diabetes screenings. This involves taking a blood sample to check your fasting blood sugar level. ? If you are at a normal weight and have a low risk  for diabetes, have this test once every three years after 47 years of age. ? If you are overweight and have a high risk for diabetes, consider being tested at a younger age or more often. Preventing infection Hepatitis B  If you have a higher risk for hepatitis B, you should be screened for this virus. You are considered at high risk for hepatitis B if: ? You were born in a country where hepatitis B is common. Ask your health care provider which countries are considered high risk. ? Your parents were born in a high-risk country, and you have not been immunized against hepatitis B (hepatitis B vaccine). ? You have HIV or AIDS. ? You use needles to inject street drugs. ? You live with someone who has hepatitis B. ? You have had sex with someone who has hepatitis B. ? You get hemodialysis treatment. ? You take certain medicines for conditions, including cancer, organ transplantation, and autoimmune conditions.  Hepatitis C  Blood testing is recommended for: ? Everyone born from [birth year] through 1966. ? Anyone with known risk factors for hepatitis C.  Sexually transmitted infections (STIs)  You should be screened for sexually transmitted infections (STIs) including gonorrhea and chlamydia if: ? You are sexually active and are younger than 47 years of age. ? You are older than 47 years of age and your health care provider tells you that you are at risk for this type of infection. ? Your sexual activity has changed since you were last screened and you are at an increased risk for chlamydia or gonorrhea. Ask your health care provider if you are at risk.  If you do not have HIV, but are at risk, it may be recommended that you take a prescription medicine daily to prevent HIV infection. This is called pre-exposure prophylaxis (PrEP). You are considered at risk if: ? You are sexually active and do not regularly use condoms or know the HIV status of your partner(s). ? You take drugs by  injection. ? You are sexually active with a partner who has HIV.  Talk with your health care provider about whether you are at high risk of being infected with HIV. If you choose to begin PrEP, you should first be tested for HIV. You should then be tested every 3 months for as long as you are taking PrEP. Pregnancy  If you are premenopausal and you may become pregnant, ask your health care provider about preconception counseling.  If you may become pregnant, take 400 to 800 micrograms (mcg) of folic acid every day.  If you want to prevent pregnancy, talk to your health care provider about birth control (contraception). Osteoporosis and menopause  Osteoporosis is a disease in which the bones lose minerals and strength with aging. This can result in serious bone fractures. Your risk for osteoporosis can be identified using a bone density scan.  If you are 85 years of age or older, or if you are at risk for osteoporosis and fractures, ask your health care provider if you should be screened.  Ask your health care provider whether you should take a calcium or vitamin D supplement to lower your risk for osteoporosis.  Menopause may have certain physical symptoms and risks.  Hormone replacement therapy may reduce some of these symptoms and risks. Talk to your health care provider about whether hormone replacement therapy is right for you. Follow these instructions at home:  Schedule regular health, dental, and eye exams.  Stay current with your immunizations.  Do not use any tobacco products including cigarettes, chewing tobacco, or electronic cigarettes.  If you are pregnant, do not drink alcohol.  If you are breastfeeding, limit how much and how often you drink alcohol.  Limit alcohol intake to no more than 1 drink per day for nonpregnant women. One drink equals 12 ounces of beer, 5 ounces of wine, or 1 ounces of hard liquor.  Do not use street drugs.  Do not share needles.  Ask  your health care provider for help if you need support or information about quitting drugs.  Tell your health care provider if you often feel depressed.  Tell your health care provider if you have ever been abused or do not feel safe at home. This information is not intended to replace advice given to you by your health care provider. Make sure you discuss any questions you have with your health care provider. Document Released: 01/29/2011 Document Revised: 12/22/2015 Document Reviewed: 04/19/2015 Elsevier Interactive Patient Education  2018 [employer]. Menopause and Herbal Products What is menopause? Menopause is the normal time of life when menstrual periods decrease in frequency and eventually stop completely. This process can take several years for some women. Menopause is complete when you have had an absence of menstruation for a full year since your last menstrual period. It usually occurs between the ages of 14 and 9. It is not common for menopause to begin before the age of 65. During menopause, your body stops producing the female hormones estrogen and progesterone. Common symptoms associated with this loss of hormones (vasomotor symptoms) are:  Hot flashes.  Hot flushes.  Night sweats.  Other common symptoms and complications of menopause include:  Decrease in sex drive.  Vaginal dryness and thinning of the walls of the vagina. This can make sex painful.  Dryness of the skin and development of wrinkles.  Headaches.  Tiredness.  Irritability.  Memory problems.  Weight gain.  Bladder infections.  Hair growth on the face and chest.  Inability to reproduce offspring (infertility).  Loss of density in the bones (osteoporosis) increasing your risk for breaks (fractures).  Depression.  Hardening and narrowing of the arteries (atherosclerosis). This increases your risk of heart attack and stroke.  What treatment options are available? There are many treatment  choices for menopause symptoms. The most common treatment is hormone replacement therapy. Many alternative therapies for menopause are emerging, including the use of herbal products. These supplements can be  found in the form of herbs, teas, oils, tinctures, and pills. Common herbal supplements for menopause are made from plants that contain phytoestrogens. Phytoestrogens are compounds that occur naturally in plants and plant products. They act like estrogen in the body. Foods and herbs that contain phytoestrogens include:  Soy.  Flax seeds.  Red clover.  Ginseng.  What menopause symptoms may be helped if I use herbal products?  Vasomotor symptoms. These may be helped by: ? Soy. Some studies show that soy may have a moderate benefit for hot flashes. ? Black cohosh. There is limited evidence indicating this may be beneficial for hot flashes. ? Evening Primrose Oil ?   Symptoms that are related to heart and blood vessel disease. These may be helped by soy. Studies have shown that soy can help to lower cholesterol.  Depression. This may be helped by: ? St. John's wort. There is limited evidence that shows this may help mild to moderate depression. ? Black cohosh. There is evidence that this may help depression and mood swings.  Osteoporosis. Soy may help to decrease bone loss that is associated with menopause and may prevent osteoporosis. Limited evidence indicates that red clover may offer some bone loss protection as well. Other herbal products that are commonly used during menopause lack enough evidence to support their use as a replacement for conventional menopause therapies. These products include evening primrose, ginseng, and red clover. What are the cases when herbal products should not be used during menopause? Do not use herbal products during menopause without your health care provider's approval if:  You are taking medicine.  You have a preexisting liver condition.  Are  there any risks in my taking herbal products during menopause? If you choose to use herbal products to help with symptoms of menopause, keep in mind that:  Different supplements have different and unmeasured amounts of herbal ingredients.  Herbal products are not regulated the same way that medicines are.  Concentrations of herbs may vary depending on the way they are prepared. For example, the concentration may be different in a pill, tea, oil, and tincture.  Little is known about the risks of using herbal products, particularly the risks of long-term use.  Some herbal supplements can be harmful when combined with certain medicines.  Most commonly reported side effects of herbal products are mild. However, if used improperly, many herbal supplements can cause serious problems. Talk to your health care provider before starting any herbal product. If problems develop, stop taking the supplement and let your health care provider know. This information is not intended to replace advice given to you by your health care provider. Make sure you discuss any questions you have with your health care provider. Document Released: 01/02/2008 Document Revised: 06/12/2016 Document Reviewed: 12/29/2013 Elsevier Interactive Patient Education  2017 [place].  Hip Exercises Ask your health care provider which exercises are safe for you. Do exercises exactly as told by your health care provider and adjust them as directed. It is normal to feel mild stretching, pulling, tightness, or discomfort as you do these exercises, but you should stop right away if you feel sudden pain or your pain gets worse.Do not begin these exercises until told by your health care provider. STRETCHING AND RANGE OF MOTION EXERCISES These exercises warm up your muscles and joints and improve the movement and flexibility of your hip. These exercises also help to relieve pain, numbness, and tingling. Exercise A: Hamstrings,  Supine  1. Lie on your back.  2. Loop a belt or towel over the ball of your left / rightfoot. The ball of your foot is on the walking surface, right under your toes. 3. Straighten your left / rightknee and slowly pull on the belt to raise your leg. ? Do not let your left / right knee bend while you do this. ? Keep your other leg flat on the floor. ? Raise the left / right leg until you feel a gentle stretch behind your left / right knee or thigh. 4. Hold this position for __________ seconds. 5. Slowly return your leg to the starting position. Repeat __________ times. Complete this stretch __________ times a day. Exercise B: Hip Rotators  1. Lie on your back on a firm surface. 2. Hold your left / right knee with your left / right hand. Hold your ankle with your other hand. 3. Gently pull your left / right knee and rotate your lower leg toward your other shoulder. ? Pull until you feel a stretch in your buttocks. ? Keep your hips and shoulders firmly planted while you do this stretch. 4. Hold this position for __________ seconds. Repeat __________ times. Complete this stretch __________ times a day. Exercise C: V-Sit (Hamstrings and Adductors)  1. Sit on the floor with your legs extended in a large "V" shape. Keep your knees straight during this exercise. 2. Start with your head and chest upright, then bend at your waist to reach for your left foot (position A). You should feel a stretch in your right inner thigh. 3. Hold this position for __________ seconds. Then slowly return to the upright position. 4. Bend at your waist to reach forward (position B). You should feel a stretch behind both of your thighs and knees. 5. Hold this position for __________ seconds. Then slowly return to the upright position. 6. Bend at your waist to reach for your right foot (position C). You should feel a stretch in your left inner thigh. 7. Hold this position for __________ seconds. Then slowly return to  the upright position. Repeat __________ times. Complete this stretch __________ times a day. Exercise D: Lunge (Hip Flexors)  1. Place your left / right knee on the floor and bend your other knee so that is directly over your ankle. You should be half-kneeling. 2. Keep good posture with your head over your shoulders. 3. Tighten your buttocks to point your tailbone downward. This helps your back to keep from arching too much. 4. You should feel a gentle stretch in the front of your left / right thigh and hip. If you do not feel any resistance, slightly slide your other foot forward and then slowly lunge forward so your knee once again lines up over your ankle. 5. Make sure your tailbone continues to point downward. 6. Hold this position for __________ seconds. Repeat __________ times. Complete this stretch __________ times a day. STRENGTHENING EXERCISES These exercises build strength and endurance in your hip. Endurance is the ability to use your muscles for a long time, even after they get tired. Exercise E: Bridge (Hip Extensors)  1. Lie on your back on a firm surface with your knees bent and your feet flat on the floor. 2. Tighten your buttocks muscles and lift your bottom off the floor until the trunk of your body is level with your thighs. ? Do not arch your back. ? You should feel the muscles working in your buttocks and the back of your thighs. If you do not feel these  muscles, slide your feet 1-2 inches (2.5-5 cm) farther away from your buttocks. 3. Hold this position for __________ seconds. 4. Slowly lower your hips to the starting position. 5. Let your muscles relax completely between repetitions. 6. If this exercise is too easy, try doing it with your arms crossed over your chest. Repeat __________ times. Complete this exercise __________ times a day. Exercise F: Straight Leg Raises - Hip Abductors  1. Lie on your side with your left / right leg in the top position. Lie so your  head, shoulder, knee, and hip line up with each other. You may bend your bottom knee to help you balance. 2. Roll your hips slightly forward, so your hips are stacked directly over each other and your left / right knee is facing forward. 3. Leading with your heel, lift your top leg 4-6 inches (10-15 cm). You should feel the muscles in your outer hip lifting. ? Do not let your foot drift forward. ? Do not let your knee roll toward the ceiling. 4. Hold this position for __________ seconds. 5. Slowly return to the starting position. 6. Let your muscles relax completely between repetitions. Repeat __________ times. Complete this exercise __________ times a day. Exercise G: Straight Leg Raises - Hip Adductors  1. Lie on your side with your left / right leg in the bottom position. Lie so your head, shoulder, knee, and hip line up. You may place your upper foot in front to help you balance. 2. Roll your hips slightly forward, so your hips are stacked directly over each other and your left / right knee is facing forward. 3. Tense the muscles in your inner thigh and lift your bottom leg 4-6 inches (10-15 cm). 4. Hold this position for __________ seconds. 5. Slowly return to the starting position. 6. Let your muscles relax completely between repetitions. Repeat __________ times. Complete this exercise __________ times a day. Exercise H: Straight Leg Raises - Quadriceps  1. Lie on your back with your left / right leg extended and your other knee bent. 2. Tense the muscles in the front of your left / right thigh. When you do this, you should see your kneecap slide up or see increased dimpling just above your knee. 3. Tighten these muscles even more and raise your leg 4-6 inches (10-15 cm) off the floor. 4. Hold this position for __________ seconds. 5. Keep these muscles tense as you lower your leg. 6. Relax the muscles slowly and completely between repetitions. Repeat __________ times. Complete this  exercise __________ times a day. Exercise I: Hip Abductors, Standing 1. Tie one end of a rubber exercise band or tubing to a secure surface, such as a table or pole. 2. Loop the other end of the band or tubing around your left / right ankle. 3. Keeping your ankle with the band or tubing directly opposite of the secured end, step away until there is tension in the tubing or band. Hold onto a chair as needed for balance. 4. Lift your left / right leg out to your side. While you do this: ? Keep your back upright. ? Keep your shoulders over your hips. ? Keep your toes pointing forward. ? Make sure to use your hip muscles to lift your leg. Do not "throw" your leg or tip your body to lift your leg. 5. Hold this position for __________ seconds. 6. Slowly return to the starting position. Repeat __________ times. Complete this exercise __________ times a day. Exercise J: Squats (  Quadriceps) 1. Stand in a door frame so your feet and knees are in line with the frame. You may place your hands on the frame for balance. 2. Slowly bend your knees and lower your hips like you are going to sit in a chair. ? Keep your lower legs in a straight-up-and-down position. ? Do not let your hips go lower than your knees. ? Do not bend your knees lower than told by your health care provider. ? If your hip pain increases, do not bend as low. 3. Hold this position for ___________ seconds. 4. Slowly push with your legs to return to standing. Do not use your hands to pull yourself to standing. Repeat __________ times. Complete this exercise __________ times a day. This information is not intended to replace advice given to you by your health care provider. Make sure you discuss any questions you have with your health care provider. Document Released: 08/03/2005 Document Revised: 04/09/2016 Document Reviewed: 07/11/2015 Elsevier Interactive Patient Education  [employer].

## 2017-02-18 NOTE — Assessment & Plan Note (Signed)
Rechecking A1c today. Await results. Continue diet and exercise. Call with any concerns.

## 2017-02-18 NOTE — Assessment & Plan Note (Signed)
Rechecking levels today. Treat as needed. Call with any concerns. Continue diet and exercise. Currently on keto.

## 2017-02-18 NOTE — Assessment & Plan Note (Signed)
Resolved! No issues. Not on medicine. Feeling well!

## 2017-02-19 LAB — COMPREHENSIVE METABOLIC PANEL
A/G RATIO: 1.7 (ref 1.2–2.2)
ALT: 55 IU/L — AB (ref 0–32)
AST: 30 IU/L (ref 0–40)
Albumin: 4.7 g/dL (ref 3.5–5.5)
Alkaline Phosphatase: 116 IU/L (ref 39–117)
BUN/Creatinine Ratio: 15 (ref 9–23)
BUN: 14 mg/dL (ref 6–24)
Bilirubin Total: 0.4 mg/dL (ref 0.0–1.2)
CALCIUM: 10.1 mg/dL (ref 8.7–10.2)
CHLORIDE: 98 mmol/L (ref 96–106)
CO2: 24 mmol/L (ref 20–29)
Creatinine, Ser: 0.93 mg/dL (ref 0.57–1.00)
GFR calc Af Amer: 85 mL/min/{1.73_m2} (ref >59)
GFR, EST NON AFRICAN AMERICAN: 73 mL/min/{1.73_m2} (ref >59)
Globulin, Total: 2.7 g/dL (ref 1.5–4.5)
Glucose: 79 mg/dL (ref 65–99)
POTASSIUM: 4.2 mmol/L (ref 3.5–5.2)
Sodium: 142 mmol/L (ref 134–144)
Total Protein: 7.4 g/dL (ref 6.0–8.5)

## 2017-02-19 LAB — CBC WITH DIFFERENTIAL/PLATELET
BASOS ABS: 0.1 10*3/uL (ref 0.0–0.2)
Basos: 1 %
EOS (ABSOLUTE): 0.1 10*3/uL (ref 0.0–0.4)
Eos: 1 %
Hematocrit: 47.6 % — ABNORMAL HIGH (ref 34.0–46.6)
Hemoglobin: 15.9 g/dL (ref 11.1–15.9)
IMMATURE GRANULOCYTES: 0 %
Immature Grans (Abs): 0 10*3/uL (ref 0.0–0.1)
Lymphocytes Absolute: 2.9 10*3/uL (ref 0.7–3.1)
Lymphs: 33 %
MCH: 31.1 pg (ref 26.6–33.0)
MCHC: 33.4 g/dL (ref 31.5–35.7)
MCV: 93 fL (ref 79–97)
MONOS ABS: 0.6 10*3/uL (ref 0.1–0.9)
Monocytes: 6 %
NEUTROS PCT: 59 %
Neutrophils Absolute: 5.2 10*3/uL (ref 1.4–7.0)
PLATELETS: 236 10*3/uL (ref 150–379)
RBC: 5.11 x10E6/uL (ref 3.77–5.28)
RDW: 13.7 % (ref 12.3–15.4)
WBC: 8.9 10*3/uL (ref 3.4–10.8)

## 2017-02-19 LAB — LIPID PANEL W/O CHOL/HDL RATIO
Cholesterol, Total: 224 mg/dL — ABNORMAL HIGH (ref 100–199)
HDL: 58 mg/dL (ref >39)
LDL Calculated: 127 mg/dL — ABNORMAL HIGH (ref 0–99)
TRIGLYCERIDES: 195 mg/dL — AB (ref 0–149)
VLDL Cholesterol Cal: 39 mg/dL (ref 5–40)

## 2017-02-19 LAB — TSH: TSH: 1.37 u[IU]/mL (ref 0.450–4.500)

## 2017-02-21 ENCOUNTER — Encounter: Payer: Self-pay | Admitting: Family Medicine

## 2017-02-21 LAB — UA/M W/RFLX CULTURE, ROUTINE
Bilirubin, UA: NEGATIVE
Glucose, UA: NEGATIVE
Leukocytes, UA: NEGATIVE
NITRITE UA: NEGATIVE
Protein, UA: NEGATIVE
RBC, UA: NEGATIVE
Specific Gravity, UA: 1.01 (ref 1.005–1.030)
UUROB: 0.2 mg/dL (ref 0.2–1.0)
pH, UA: 6 (ref 5.0–7.5)

## 2017-02-21 LAB — MICROALBUMIN, URINE WAIVED
Creatinine, Urine Waived: 50 mg/dL (ref 10–300)
Microalb, Ur Waived: 10 mg/L (ref 0–19)
Microalb/Creat Ratio: <30 mg/g (ref <30)

## 2017-02-21 LAB — MICROSCOPIC EXAMINATION

## 2017-02-21 LAB — BAYER DCA HB A1C WAIVED: HB A1C: 5.2 % (ref <7.0)

## 2017-02-22 ENCOUNTER — Encounter: Payer: Self-pay | Admitting: Family Medicine

## 2017-04-05 ENCOUNTER — Encounter: Payer: Self-pay | Admitting: Family Medicine

## 2017-04-05 ENCOUNTER — Ambulatory Visit (INDEPENDENT_AMBULATORY_CARE_PROVIDER_SITE_OTHER): Payer: BC Managed Care – PPO | Admitting: Family Medicine

## 2017-04-05 DIAGNOSIS — I10 Essential (primary) hypertension: Secondary | ICD-10-CM | POA: Diagnosis not present

## 2017-04-05 NOTE — Progress Notes (Signed)
BP 124/79 (BP Location: Left Arm, Patient Position: Sitting, Cuff Size: Large)   Pulse 74   Temp 98.8 F (37.1 C)   Wt 204 lb 4 oz (92.6 kg)   SpO2 97%   BMI 36.18 kg/m    Subjective:    Patient ID: Paula Clay, female    DOB: December 28, 1970, 46 y.o.   MRN: [id number]  HPI: Paula Clay is a 46 y.o. female  Chief Complaint  Patient presents with  . Hypertension   HYPERTENSION Hypertension status: controlled  Satisfied with current treatment? yes Duration of hypertension: chronic BP monitoring frequency:  not checking BP medication side effects:  no Medication compliance: off medicine Previous BP meds: HCTZ Aspirin: no Recurrent headaches: no Visual changes: no Palpitations: no Dyspnea: no Chest pain: no Lower extremity edema: no Dizzy/lightheaded: no  Relevant past medical, surgical, family and social history reviewed and updated as indicated. Interim medical history since our last visit reviewed. Allergies and medications reviewed and updated.  Review of Systems  Constitutional: Negative.   Respiratory: Negative.   Cardiovascular: Negative.   Psychiatric/Behavioral: Negative.     Per HPI unless specifically indicated above     Objective:    BP 124/79 (BP Location: Left Arm, Patient Position: Sitting, Cuff Size: Large)   Pulse 74   Temp 98.8 F (37.1 C)   Wt 204 lb 4 oz (92.6 kg)   SpO2 97%   BMI 36.18 kg/m   Wt Readings from Last 3 Encounters:  04/05/17 204 lb 4 oz (92.6 kg)  02/18/17 207 lb (93.9 kg)  11/16/16 218 lb 9.6 oz (99.2 kg)    Physical Exam  Constitutional: She is oriented to person, place, and time. She appears well-developed and well-nourished. No distress.  HENT:  Head: Normocephalic and atraumatic.  Right Ear: Hearing normal.  Left Ear: Hearing normal.  Nose: Nose normal.  Eyes: Conjunctivae and lids are normal. Right eye exhibits no discharge. Left eye exhibits no discharge. No scleral icterus.  Cardiovascular:  Normal rate, regular rhythm, normal heart sounds and intact distal pulses.  Exam reveals no gallop and no friction rub.   No murmur heard. Pulmonary/Chest: Effort normal and breath sounds normal. No respiratory distress. She has no wheezes. She has no rales. She exhibits no tenderness.  Musculoskeletal: Normal range of motion.  Neurological: She is alert and oriented to person, place, and time.  Skin: Skin is warm, dry and intact. No rash noted. She is not diaphoretic. No erythema. No pallor.  Psychiatric: She has a normal mood and affect. Her speech is normal and behavior is normal. Judgment and thought content normal. Cognition and memory are normal.  Nursing note and vitals reviewed.   Results for orders placed or performed in visit on 02/18/17  Microscopic Examination  Result Value Ref Range   WBC, UA 0-5 0 - 5 /hpf   RBC, UA 0-2 0 - 2 /hpf   Epithelial Cells (non renal) 0-10 0 - 10 /hpf  Bayer DCA Hb A1c Waived  Result Value Ref Range   Bayer DCA Hb A1c Waived 5.2 <7.0 %  CBC with Differential/Platelet  Result Value Ref Range   WBC 8.9 3.4 - 10.8 x10E3/uL   RBC 5.11 3.77 - 5.28 x10E6/uL   Hemoglobin 15.9 11.1 - 15.9 g/dL   Hematocrit 47.6 (H) 34.0 - 46.6 %   MCV 93 79 - 97 fL   MCH 31.1 26.6 - 33.0 pg   MCHC 33.4 31.5 - 35.7 g/dL  RDW 13.7 12.3 - 15.4 %   Platelets 236 150 - 379 x10E3/uL   Neutrophils 59 Not Estab. %   Lymphs 33 Not Estab. %   Monocytes 6 Not Estab. %   Eos 1 Not Estab. %   Basos 1 Not Estab. %   Neutrophils Absolute 5.2 1.4 - 7.0 x10E3/uL   Lymphocytes Absolute 2.9 0.7 - 3.1 x10E3/uL   Monocytes Absolute 0.6 0.1 - 0.9 x10E3/uL   EOS (ABSOLUTE) 0.1 0.0 - 0.4 x10E3/uL   Basophils Absolute 0.1 0.0 - 0.2 x10E3/uL   Immature Granulocytes 0 Not Estab. %   Immature Grans (Abs) 0.0 0.0 - 0.1 x10E3/uL  Comprehensive metabolic panel  Result Value Ref Range   Glucose 79 65 - 99 mg/dL   BUN 14 6 - 24 mg/dL   Creatinine, Ser 0.93 0.57 - 1.00 mg/dL   GFR calc  non Af Amer 73 >59 mL/min/1.73   GFR calc Af Amer 85 >59 mL/min/1.73   BUN/Creatinine Ratio 15 9 - 23   Sodium 142 134 - 144 mmol/L   Potassium 4.2 3.5 - 5.2 mmol/L   Chloride 98 96 - 106 mmol/L   CO2 24 20 - 29 mmol/L   Calcium 10.1 8.7 - 10.2 mg/dL   Total Protein 7.4 6.0 - 8.5 g/dL   Albumin 4.7 3.5 - 5.5 g/dL   Globulin, Total 2.7 1.5 - 4.5 g/dL   Albumin/Globulin Ratio 1.7 1.2 - 2.2   Bilirubin Total 0.4 0.0 - 1.2 mg/dL   Alkaline Phosphatase 116 39 - 117 IU/L   AST 30 0 - 40 IU/L   ALT 55 (H) 0 - 32 IU/L  Lipid Panel w/o Chol/HDL Ratio  Result Value Ref Range   Cholesterol, Total 224 (H) 100 - 199 mg/dL   Triglycerides 195 (H) 0 - 149 mg/dL   HDL 58 >39 mg/dL   VLDL Cholesterol Cal 39 5 - 40 mg/dL   LDL Calculated 127 (H) 0 - 99 mg/dL  Microalbumin, Urine Waived  Result Value Ref Range   Microalb, Ur Waived 10 0 - 19 mg/L   Creatinine, Urine Waived 50 10 - 300 mg/dL   Microalb/Creat Ratio <30 <30 mg/g  TSH  Result Value Ref Range   TSH 1.370 0.450 - 4.500 uIU/mL  UA/M w/rflx Culture, Routine  Result Value Ref Range   Specific Gravity, UA 1.010 1.005 - 1.030   pH, UA 6.0 5.0 - 7.5   Color, UA Yellow Yellow   Appearance Ur Clear Clear   Leukocytes, UA Negative Negative   Protein, UA Negative Negative/Trace   Glucose, UA Negative Negative   Ketones, UA Trace (A) Negative   RBC, UA Negative Negative   Bilirubin, UA Negative Negative   Urobilinogen, Ur 0.2 0.2 - 1.0 mg/dL   Nitrite, UA Negative Negative   Microscopic Examination See below:       Assessment & Plan:   Problem List Items Addressed This Visit      Cardiovascular and Mediastinum   Hypertension    Under good control off medicine. Stay off meds. Continue to monitor. Call with any concerns.           Follow up plan: Return in about 5 months (around 09/05/2017) for 6 month follow up.

## 2017-04-05 NOTE — Assessment & Plan Note (Signed)
Under good control off medicine. Stay off meds. Continue to monitor. Call with any concerns.

## 2017-08-06 ENCOUNTER — Ambulatory Visit: Payer: BC Managed Care – PPO | Admitting: Family Medicine

## 2017-08-29 ENCOUNTER — Ambulatory Visit: Payer: BC Managed Care – PPO | Admitting: Family Medicine

## 2017-08-29 ENCOUNTER — Encounter: Payer: Self-pay | Admitting: Family Medicine

## 2017-08-29 VITALS — BP 139/80 | HR 84 | Wt 213.2 lb

## 2017-08-29 DIAGNOSIS — S39012A Strain of muscle, fascia and tendon of lower back, initial encounter: Secondary | ICD-10-CM

## 2017-08-29 MED ORDER — CYCLOBENZAPRINE HCL 5 MG PO TABS: 5.0000 mg | ORAL_TABLET | Freq: Three times a day (TID) | ORAL | 1 refills | Status: DC | PRN

## 2017-08-29 MED ORDER — PREDNISONE 10 MG PO TABS: ORAL_TABLET | ORAL | 0 refills | Status: DC

## 2017-08-29 MED ORDER — TRAMADOL HCL 50 MG PO TABS: 50.0000 mg | ORAL_TABLET | Freq: Three times a day (TID) | ORAL | 0 refills | Status: DC | PRN

## 2017-08-29 NOTE — Progress Notes (Signed)
BP 139/80   Pulse 84   Wt 213 lb 3 oz (96.7 kg)   SpO2 99%   BMI 37.76 kg/m    Subjective:    Patient ID: Paula Clay, female    DOB: 1970-07-31, 47 y.o.   MRN: [id number]  HPI: Paula Clay is a 47 y.o. female  Chief Complaint  Patient presents with  . Back Pain    pain began 1 week ago, worsened by bending over.  . Neck Pain    pain on right side of neck began 1 week ago.  Patient 6 days ago slipped on a slanted yard with sudden onset of low back pain without radiation pain is been consistent had some leftover Toradol and is taking that without any real relief.  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  Again no blood in stool or urine no radicular symptoms no leakage of stool or urine or unexplained soiling. And today while starting to bend developed severe low back pain with marked discomfort when moves uses her legs such as simple activities such as breaking or using the gas pedal on a car.  Especially getting in and out of the car.  Also hurts with moving upper body also.  Even arms.  Relevant past medical, surgical, family and social history reviewed and updated as indicated. Interim medical history since our last visit reviewed. Allergies and medications reviewed and updated.  Review of Systems  Constitutional: Negative.   Respiratory: Negative.   Cardiovascular: Negative.     Per HPI unless specifically indicated above     Objective:    BP 139/80   Pulse 84   Wt 213 lb 3 oz (96.7 kg)   SpO2 99%   BMI 37.76 kg/m   Wt Readings from Last 3 Encounters:  08/29/17 213 lb 3 oz (96.7 kg)  04/05/17 204 lb 4 oz (92.6 kg)  02/18/17 207 lb (93.9 kg)    Physical Exam  Constitutional: She is oriented to person, place, and time. She appears well-developed and well-nourished.  HENT:  Head: Normocephalic and atraumatic.  Eyes: Conjunctivae and EOM are normal.  Neck: Normal range of motion.    Cardiovascular: Normal rate, regular rhythm and normal heart sounds.  Pulmonary/Chest: Effort normal and breath sounds normal.  Musculoskeletal: Normal range of motion.  Neurological: She is alert and oriented to person, place, and time.  Skin: No erythema.  Psychiatric: She has a normal mood and affect. Her behavior is normal. Judgment and thought content normal.    Results for orders placed or performed in visit on 02/18/17  Microscopic Examination  Result Value Ref Range   WBC, UA 0-5 0 - 5 /hpf   RBC, UA 0-2 0 - 2 /hpf   Epithelial Cells (non renal) 0-10 0 - 10 /hpf  Bayer DCA Hb A1c Waived  Result Value Ref Range   Bayer DCA Hb A1c Waived 5.2 <7.0 %  CBC with Differential/Platelet  Result Value Ref Range   WBC 8.9 3.4 - 10.8 x10E3/uL   RBC 5.11 3.77 - 5.28 x10E6/uL   Hemoglobin 15.9 11.1 - 15.9 g/dL   Hematocrit 47.6 (H) 34.0 - 46.6 %   MCV 93 79 - 97 fL   MCH 31.1 26.6 - 33.0 pg   MCHC 33.4 31.5 - 35.7 g/dL   RDW 13.7 12.3 - 15.4 %   Platelets 236 150 - 379 x10E3/uL   Neutrophils  59 Not Estab. %   Lymphs 33 Not Estab. %   Monocytes 6 Not Estab. %   Eos 1 Not Estab. %   Basos 1 Not Estab. %   Neutrophils Absolute 5.2 1.4 - 7.0 x10E3/uL   Lymphocytes Absolute 2.9 0.7 - 3.1 x10E3/uL   Monocytes Absolute 0.6 0.1 - 0.9 x10E3/uL   EOS (ABSOLUTE) 0.1 0.0 - 0.4 x10E3/uL   Basophils Absolute 0.1 0.0 - 0.2 x10E3/uL   Immature Granulocytes 0 Not Estab. %   Immature Grans (Abs) 0.0 0.0 - 0.1 x10E3/uL  Comprehensive metabolic panel  Result Value Ref Range   Glucose 79 65 - 99 mg/dL   BUN 14 6 - 24 mg/dL   Creatinine, Ser 0.93 0.57 - 1.00 mg/dL   GFR calc non Af Amer 73 >59 mL/min/1.73   GFR calc Af Amer 85 >59 mL/min/1.73   BUN/Creatinine Ratio 15 9 - 23   Sodium 142 134 - 144 mmol/L   Potassium 4.2 3.5 - 5.2 mmol/L   Chloride 98 96 - 106 mmol/L   CO2 24 20 - 29 mmol/L   Calcium 10.1 8.7 - 10.2 mg/dL   Total Protein 7.4 6.0 - 8.5 g/dL   Albumin 4.7 3.5 - 5.5 g/dL    Globulin, Total 2.7 1.5 - 4.5 g/dL   Albumin/Globulin Ratio 1.7 1.2 - 2.2   Bilirubin Total 0.4 0.0 - 1.2 mg/dL   Alkaline Phosphatase 116 39 - 117 IU/L   AST 30 0 - 40 IU/L   ALT 55 (H) 0 - 32 IU/L  Lipid Panel w/o Chol/HDL Ratio  Result Value Ref Range   Cholesterol, Total 224 (H) 100 - 199 mg/dL   Triglycerides 195 (H) 0 - 149 mg/dL   HDL 58 >39 mg/dL   VLDL Cholesterol Cal 39 5 - 40 mg/dL   LDL Calculated 127 (H) 0 - 99 mg/dL  Microalbumin, Urine Waived  Result Value Ref Range   Microalb, Ur Waived 10 0 - 19 mg/L   Creatinine, Urine Waived 50 10 - 300 mg/dL   Microalb/Creat Ratio <30 <30 mg/g  TSH  Result Value Ref Range   TSH 1.370 0.450 - 4.500 uIU/mL  UA/M w/rflx Culture, Routine  Result Value Ref Range   Specific Gravity, UA 1.010 1.005 - 1.030   pH, UA 6.0 5.0 - 7.5   Color, UA Yellow Yellow   Appearance Ur Clear Clear   Leukocytes, UA Negative Negative   Protein, UA Negative Negative/Trace   Glucose, UA Negative Negative   Ketones, UA Trace (A) Negative   RBC, UA Negative Negative   Bilirubin, UA Negative Negative   Urobilinogen, Ur 0.2 0.2 - 1.0 mg/dL   Nitrite, UA Negative Negative   Microscopic Examination See below:       Assessment & Plan:   Problem List Items Addressed This Visit    None    Visit Diagnoses    Strain of muscle, fascia and tendon of lower back, initial encounter    -  Primary    Discussed care and treatment of muscle strain. Discussed use of prednisone and not to take other nonsteroidal anti-inflammatories with this. We will go ahead and give tramadol limitations and cautions. Also Flexeril with cautions about drowsiness. Out of work tomorrow Discussed red flags of cauda equina syndrome Recommended massage therapy If not getting better will need to recheck.  Follow up plan: Return for As scheduled.

## 2017-09-03 ENCOUNTER — Telehealth: Payer: Self-pay

## 2017-09-03 NOTE — Telephone Encounter (Signed)
Phone call Discussed with patient was getting somewhat better but now worse.  [facility]

## 2017-09-03 NOTE — Telephone Encounter (Signed)
Copied from [place] [id number]. Topic: General - Other >> Sep 03, 2017  7:20 AM Scherrie Gerlach wrote: Reason for CRM: pt wants to let Dr Jeananne Rama know her lower back is not any better. Still having muscle spasms. Hard to walk, lay down, and pt is out of work.  Would like to know what he would have her do now? Dr Wynetta Emery is her regular dr, but she was out Friday

## 2017-09-03 NOTE — Telephone Encounter (Signed)
Call pt 

## 2017-11-08 ENCOUNTER — Other Ambulatory Visit: Payer: Self-pay | Admitting: Family Medicine

## 2017-11-19 ENCOUNTER — Other Ambulatory Visit: Payer: Self-pay | Admitting: Family Medicine

## 2017-12-18 ENCOUNTER — Other Ambulatory Visit: Payer: Self-pay | Admitting: Family Medicine

## 2017-12-19 NOTE — Telephone Encounter (Signed)
Buspirone refill Last OV: 02/18/17 pt has upcoming visit on 02/19/18 Last Refill:11/19/17 #90 tabs Pharmacy:[facility] [address] PCP: Park Liter DO

## 2018-01-02 ENCOUNTER — Other Ambulatory Visit: Payer: Self-pay | Admitting: Family Medicine

## 2018-01-02 DIAGNOSIS — Z1231 Encounter for screening mammogram for malignant neoplasm of breast: Secondary | ICD-10-CM

## 2018-02-17 ENCOUNTER — Ambulatory Visit
Admission: RE | Admit: 2018-02-17 | Discharge: 2018-02-17 | Disposition: A | Discharge status: Home / Self Care | Payer: BC Managed Care – PPO | Source: Ambulatory Visit | Attending: Family Medicine | Admitting: Family Medicine

## 2018-02-17 DIAGNOSIS — Z1231 Encounter for screening mammogram for malignant neoplasm of breast: Secondary | ICD-10-CM | POA: Insufficient documentation

## 2018-02-18 NOTE — Progress Notes (Addendum)
BP 113/77   Pulse 73   Temp 98.6 F (37 C) (Oral)   Ht 5' 3.19" (1.605 m)   Wt 213 lb 3.2 oz (96.7 kg)   SpO2 90%   BMI 37.54 kg/m    Subjective:    Patient ID: Paula Clay, female    DOB: [birth year], 47 y.o.   MRN: [id number]  HPI: Paula Clay is a 47 y.o. female presenting on 02/19/2018 for comprehensive medical examination. Current medical complaints include:  HYPERTENSION / HYPERLIPIDEMIA Satisfied with current treatment? yes Duration of hypertension: chronic BP monitoring frequency: not checking BP medication side effects: Not on anything Past BP meds: Lisinopril, HCTZ Duration of hyperlipidemia: chronic Cholesterol medication side effects: Not on anything Cholesterol supplements: none Past cholesterol medications: None Medication compliance: Not on anything Aspirin: no Recent stressors: no Recurrent headaches: no Visual changes: no Palpitations: yes Dyspnea: no Chest pain: no Lower extremity edema: no Dizzy/lightheaded: yes  Impaired Fasting Glucose HbA1C: 5.6 Duration of elevated blood sugar: chronic Polydipsia: no Polyuria: no Weight change: no Visual disturbance: no Glucose Monitoring: no Diabetic Education: Not Completed   ANXIETY/DEPRESSION Duration:controlled Anxious mood: no  Excessive worrying: no Irritability: no  Sweating: no Nausea: no Palpitations:no Hyperventilation: no Panic attacks: no Agoraphobia: no  Obscessions/compulsions: no Depressed mood: no Depression screen [facility] 2/9 02/19/2018 02/18/2017 08/29/2016  Decreased Interest 0 0 0  Down, Depressed, Hopeless 0 1 0  PHQ - 2 Score 0 1 0  Altered sleeping 0 0 0  Tired, decreased energy 0 1 1  Change in appetite 0 0 1  Feeling bad or failure about yourself  0 0 1  Trouble concentrating 0 0 0  Moving slowly or fidgety/restless 0 0 0  Suicidal thoughts 0 0 0  PHQ-9 Score 0 2 3  Difficult doing work/chores Not difficult at all - -   GAD 7 : Generalized Anxiety Score  02/19/2018 02/18/2017 08/29/2016  Nervous, Anxious, on Edge 0 1 1  Control/stop worrying 0 0 0  Worry too much - different things 0 0 0  Trouble relaxing 0 - 0  Restless 0 0 0  Easily annoyed or irritable 0 1 1  Afraid - awful might happen 0 1 0  Total GAD 7 Score 0 - 2  Anxiety Difficulty Not difficult at all Not difficult at all Not difficult at all   Anhedonia: no Weight changes: no Insomnia: no   Hypersomnia: no Fatigue/loss of energy: no Feelings of worthlessness: no Feelings of guilt: no Impaired concentration/indecisiveness: no Suicidal ideations: no  Crying spells: no Recent Stressors/Life Changes: no   Relationship problems: no   Family stress: no     Financial stress: no    Job stress: no    Recent death/loss: no  She currently lives with: kids Menopausal Symptoms: yes- some hot flashes  Depression Screen done today and results listed below:  Depression screen [facility] 2/9 02/19/2018 02/18/2017 08/29/2016  Decreased Interest 0 0 0  Down, Depressed, Hopeless 0 1 0  PHQ - 2 Score 0 1 0  Altered sleeping 0 0 0  Tired, decreased energy 0 1 1  Change in appetite 0 0 1  Feeling bad or failure about yourself  0 0 1  Trouble concentrating 0 0 0  Moving slowly or fidgety/restless 0 0 0  Suicidal thoughts 0 0 0  PHQ-9 Score 0 2 3  Difficult doing work/chores Not difficult at all - -    Past Medical History:  Past Medical History:  Diagnosis Date  . Actinic keratosis   . Anxiety   . Cancer ([place])    skin ca  . Depression   . GERD (gastroesophageal reflux disease)   . GERD (gastroesophageal reflux disease)   . Hyperlipidemia   . Hypertension   . Hypertension   . Pre-diabetes   . Thyroid goiter     Surgical History:  Past Surgical History:  Procedure Laterality Date  . ABDOMINAL HYSTERECTOMY  06/2016   total  . BREAST CYST ASPIRATION     ? side-neg  . NASAL SEPTUM SURGERY    . TONSILLECTOMY      Medications:  Current Outpatient Medications on File Prior to  Visit  Medication Sig  . B-COMPLEX-C PO Take by mouth.  . Cholecalciferol (VITAMIN D3) 2000 units CHEW Chew by mouth.   No current facility-administered medications on file prior to visit.     Allergies:  Allergies  Allergen Reactions  . Lisinopril Cough    Social History:  Social History   Socioeconomic History  . Marital status: Divorced    Spouse name: Not on file  . Number of children: Not on file  . Years of education: Not on file  . Highest education level: Not on file  Occupational History  . Not on file  Social Needs  . Financial resource strain: Not on file  . Food insecurity:    Worry: Not on file    Inability: Not on file  . Transportation needs:    Medical: Not on file    Non-medical: Not on file  Tobacco Use  . Smoking status: Never Smoker  . Smokeless tobacco: Never Used  Substance and Sexual Activity  . Alcohol use: No    Comment: Very Rarely  . Drug use: No  . Sexual activity: Never  Lifestyle  . Physical activity:    Days per week: Not on file    Minutes per session: Not on file  . Stress: Not on file  Relationships  . Social connections:    Talks on phone: Not on file    Gets together: Not on file    Attends religious service: Not on file    Active member of club or organization: Not on file    Attends meetings of clubs or organizations: Not on file    Relationship status: Not on file  . Intimate partner violence:    Fear of current or ex partner: Not on file    Emotionally abused: Not on file    Physically abused: Not on file    Forced sexual activity: Not on file  Other Topics Concern  . Not on file  Social History Narrative  . Not on file   Social History   Tobacco Use  Smoking Status Never Smoker  Smokeless Tobacco Never Used   Social History   Substance and Sexual Activity  Alcohol Use No   Comment: Very Rarely    Family History:  Family History  Problem Relation Age of Onset  . Diabetes Mother   . Hypertension  Mother   . Bipolar disorder Mother   . Diabetes Father   . Cancer Father        bladder  . Renal Cyst Father   . Pulmonary Hypertension Father   . Thyroid disease Sister   . Hypertension Maternal Grandmother   . Thyroid disease Maternal Grandmother   . Glaucoma Paternal Grandmother   . Osteoporosis Paternal Grandmother   . Breast cancer Neg Hx  Past medical history, surgical history, medications, allergies, family history and social history reviewed with patient today and changes made to appropriate areas of the chart.   Review of Systems  Constitutional: Positive for diaphoresis. Negative for chills, fever, malaise/fatigue and weight loss.  HENT: Negative.   Eyes: Negative.   Respiratory: Negative.   Cardiovascular: Positive for palpitations. Negative for chest pain, orthopnea, claudication, leg swelling and PND.  Gastrointestinal: Negative.   Genitourinary: Negative.   Musculoskeletal: Positive for joint pain (L knee- hurts going up and down the stairs, aching and sharp, bending and stairs make it better, nothing makes it better, no radiation, + crepitus, no popping or locking, no weakness). Negative for back pain, falls, myalgias and neck pain.  Skin: Negative.   Neurological: Positive for dizziness. Negative for tingling, tremors, sensory change, speech change, focal weakness, seizures, loss of consciousness, weakness and headaches.  Endo/Heme/Allergies: Negative.   Psychiatric/Behavioral: Negative.     All other ROS negative except what is listed above and in the HPI.      Objective:    BP 113/77   Pulse 73   Temp 98.6 F (37 C) (Oral)   Ht 5' 3.19" (1.605 m)   Wt 213 lb 3.2 oz (96.7 kg)   SpO2 90%   BMI 37.54 kg/m   Wt Readings from Last 3 Encounters:  02/19/18 213 lb 3.2 oz (96.7 kg)  08/29/17 213 lb 3 oz (96.7 kg)  04/05/17 204 lb 4 oz (92.6 kg)    Physical Exam  Constitutional: She is oriented to person, place, and time. She appears well-developed and  well-nourished. No distress.  HENT:  Head: Normocephalic and atraumatic.  Right Ear: Hearing, tympanic membrane, external ear and ear canal normal.  Left Ear: Hearing, tympanic membrane, external ear and ear canal normal.  Nose: Nose normal.  Mouth/Throat: Uvula is midline, oropharynx is clear and moist and mucous membranes are normal. No oropharyngeal exudate.  Eyes: Pupils are equal, round, and reactive to light. Conjunctivae, EOM and lids are normal. Right eye exhibits no discharge. Left eye exhibits no discharge. No scleral icterus.  Neck: Normal range of motion. Neck supple. No JVD present. No tracheal deviation present. No thyromegaly present.  Cardiovascular: Normal rate, regular rhythm, normal heart sounds and intact distal pulses. Exam reveals no gallop and no friction rub.  No murmur heard. Pulmonary/Chest: Effort normal and breath sounds normal. No stridor. No respiratory distress. She has no wheezes. She has no rales. She exhibits no tenderness. Right breast exhibits no inverted nipple, no mass, no nipple discharge, no skin change and no tenderness. Left breast exhibits no inverted nipple, no mass, no nipple discharge, no skin change and no tenderness. No breast swelling, tenderness, discharge or bleeding. Breasts are symmetrical.  Abdominal: Soft. Bowel sounds are normal. She exhibits no distension and no mass. There is no tenderness. There is no rebound and no guarding. No hernia.  Genitourinary:  Genitourinary Comments: Pelvic exam deferred with shared decision making  Musculoskeletal: Normal range of motion. She exhibits no edema, tenderness or deformity.  FROM of L knee with mild crepitus, Negative anterior and posterior drawer, negative mcmurrays, negative appley's compression and distraction  Lymphadenopathy:    She has no cervical adenopathy.  Neurological: She is alert and oriented to person, place, and time. She displays normal reflexes. No cranial nerve deficit or sensory  deficit. She exhibits normal muscle tone. Coordination normal.  Skin: Skin is warm, dry and intact. Capillary refill takes less than 2 seconds. No  rash noted. She is not diaphoretic. No erythema. No pallor.  Psychiatric: She has a normal mood and affect. Her speech is normal and behavior is normal. Judgment and thought content normal. Cognition and memory are normal.  Nursing note and vitals reviewed.   Results for orders placed or performed in visit on 02/19/18  Microscopic Examination  Result Value Ref Range   WBC, UA 0-5 0 - 5 /hpf   RBC, UA None seen 0 - 2 /hpf   Epithelial Cells (non renal) 0-10 0 - 10 /hpf   Bacteria, UA None seen None seen/Few  Bayer DCA Hb A1c Waived  Result Value Ref Range   HB A1C (BAYER DCA - WAIVED) 5.6 <7.0 %  Comprehensive metabolic panel  Result Value Ref Range   Glucose 86 65 - 99 mg/dL   BUN 18 6 - 24 mg/dL   Creatinine, Ser 0.78 0.57 - 1.00 mg/dL   GFR calc non Af Amer 90 >59 mL/min/1.73   GFR calc Af Amer 104 >59 mL/min/1.73   BUN/Creatinine Ratio 23 9 - 23   Sodium 141 134 - 144 mmol/L   Potassium 4.6 3.5 - 5.2 mmol/L   Chloride 100 96 - 106 mmol/L   CO2 24 20 - 29 mmol/L   Calcium 9.7 8.7 - 10.2 mg/dL   Total Protein 7.6 6.0 - 8.5 g/dL   Albumin 5.0 3.5 - 5.5 g/dL   Globulin, Total 2.6 1.5 - 4.5 g/dL   Albumin/Globulin Ratio 1.9 1.2 - 2.2   Bilirubin Total 0.4 0.0 - 1.2 mg/dL   Alkaline Phosphatase 108 39 - 117 IU/L   AST 18 0 - 40 IU/L   ALT 28 0 - 32 IU/L  Lipid Panel w/o Chol/HDL Ratio  Result Value Ref Range   Cholesterol, Total 239 (H) 100 - 199 mg/dL   Triglycerides 146 0 - 149 mg/dL   HDL 55 >39 mg/dL   VLDL Cholesterol Cal 29 5 - 40 mg/dL   LDL Calculated 155 (H) 0 - 99 mg/dL  Microalbumin, Urine Waived  Result Value Ref Range   Microalb, Ur Waived 10 0 - 19 mg/L   Creatinine, Urine Waived 10 10 - 300 mg/dL   Microalb/Creat Ratio <30 <30 mg/g  TSH  Result Value Ref Range   TSH 0.997 0.450 - 4.500 uIU/mL  UA/M w/rflx  Culture, Routine  Result Value Ref Range   Specific Gravity, UA <1.005 (L) 1.005 - 1.030   pH, UA 6.0 5.0 - 7.5   Color, UA Yellow Yellow   Appearance Ur Clear Clear   Leukocytes, UA Trace (A) Negative   Protein, UA Negative Negative/Trace   Glucose, UA Negative Negative   Ketones, UA Negative Negative   RBC, UA Negative Negative   Bilirubin, UA Negative Negative   Urobilinogen, Ur 0.2 0.2 - 1.0 mg/dL   Nitrite, UA Negative Negative   Microscopic Examination See below:   CBC with Differential/Platelet  Result Value Ref Range   WBC 7.0 3.4 - 10.8 x10E3/uL   RBC 4.99 3.77 - 5.28 x10E6/uL   Hemoglobin 15.1 11.1 - 15.9 g/dL   Hematocrit 46.4 34.0 - 46.6 %   MCV 93 79 - 97 fL   MCH 30.3 26.6 - 33.0 pg   MCHC 32.5 31.5 - 35.7 g/dL   RDW 13.5 12.3 - 15.4 %   Platelets 211 150 - 450 x10E3/uL   Neutrophils 50 Not Estab. %   Lymphs 40 Not Estab. %   Monocytes 7 Not Estab. %  Eos 2 Not Estab. %   Basos 1 Not Estab. %   Neutrophils Absolute 3.5 1.4 - 7.0 x10E3/uL   Lymphocytes Absolute 2.8 0.7 - 3.1 x10E3/uL   Monocytes Absolute 0.5 0.1 - 0.9 x10E3/uL   EOS (ABSOLUTE) 0.2 0.0 - 0.4 x10E3/uL   Basophils Absolute 0.1 0.0 - 0.2 x10E3/uL   Immature Granulocytes 0 Not Estab. %   Immature Grans (Abs) 0.0 0.0 - 0.1 x10E3/uL  Measles/Mumps/Rubella Immunity  Result Value Ref Range   Rubella Antibodies, IGG 4.41 Immune >0.99 index   RUBEOLA AB, IGG 51.4 Immune >29.9 AU/mL   MUMPS ABS, IGG 201.0 Immune >10.9 AU/mL  Luteinizing hormone  Result Value Ref Range   LH 70.6 mIU/mL  Follicle stimulating hormone  Result Value Ref Range   FSH 62.7 mIU/mL  Estradiol  Result Value Ref Range   Estradiol 10.6 pg/mL  Hepatitis B surface antibody  Result Value Ref Range   Hepatitis B Surf Ab Quant <3.1 (L) Immunity>9.9 mIU/mL      Assessment & Plan:   Problem List Items Addressed This Visit      Cardiovascular and Mediastinum   RESOLVED: Hypertension    Under good control off medicine. Call  with any concerns. Continue to monitor. Call with any concerns.       Relevant Orders   CBC with Differential/Platelet   Comprehensive metabolic panel (Completed)   Microalbumin, Urine Waived (Completed)   UA/M w/rflx Culture, Routine (Completed)     Endocrine   Thyroid goiter    Checking labs today. Await results. Continue to monitor.       Relevant Orders   CBC with Differential/Platelet   Comprehensive metabolic panel (Completed)   TSH (Completed)   UA/M w/rflx Culture, Routine (Completed)     Other   Hyperlipidemia    Rechecking labs today. Await results. Call with any concerns. Continue to monitor.       Relevant Orders   CBC with Differential/Platelet   Comprehensive metabolic panel (Completed)   Lipid Panel w/o Chol/HDL Ratio (Completed)   UA/M w/rflx Culture, Routine (Completed)   Pre-diabetes    Under good control with A1c of 5.6. Continue diet and exercise. Call with any concerns.       Relevant Orders   Bayer DCA Hb A1c Waived (Completed)   CBC with Differential/Platelet   Comprehensive metabolic panel (Completed)   UA/M w/rflx Culture, Routine (Completed)   Anxiety    Under good control. Continue current regimen. Continue to monitor. Refills given today.      Relevant Medications   busPIRone (BUSPAR) 15 MG tablet   escitalopram (LEXAPRO) 20 MG tablet   Other Relevant Orders   CBC with Differential/Platelet   Comprehensive metabolic panel (Completed)   UA/M w/rflx Culture, Routine (Completed)   Depression    Under good control. Continue current regimen. Continue to monitor. Refills given today.      Relevant Medications   busPIRone (BUSPAR) 15 MG tablet   escitalopram (LEXAPRO) 20 MG tablet   Other Relevant Orders   CBC with Differential/Platelet   Comprehensive metabolic panel (Completed)   UA/M w/rflx Culture, Routine (Completed)    Other Visit Diagnoses    Routine general medical examination at a health care facility    -  Primary    Vacccines declined. Screening labs checked today. Pap N/A. Mammogram up to date. Continue diet and exercise. Call with concerns.    Relevant Orders   Bayer DCA Hb A1c Waived (Completed)   CBC with Differential/Platelet  Comprehensive metabolic panel (Completed)   Lipid Panel w/o Chol/HDL Ratio (Completed)   Microalbumin, Urine Waived (Completed)   TSH (Completed)   UA/M w/rflx Culture, Routine (Completed)   Perimenopause       Checking labs today. Await results. Call with any concerns.    Relevant Orders   LH   FSH   Estradiol   Screening for tuberculosis       Labs drawn today. Await results.    Relevant Orders   QuantiFERON-TB Gold Plus   Pre-employment examination       Checking Hep B and MMR immunity. Await results.    Relevant Orders   Measles/Mumps/Rubella Immunity   Hepatitis B surface antibody   Family history of colon cancer       Referral to GI made today   Relevant Orders   Ambulatory referral to Gastroenterology   Acute pain of left knee       Will try exercises and PRN tylenol and aleve. Call with any concerns.        Follow up plan: Return in about 6 months (around 08/22/2018) for Follow up mood and IFG.   LABORATORY TESTING:  - Pap smear: not applicable  IMMUNIZATIONS:   - Tdap: Tetanus vaccination status reviewed: Refused. - Influenza: Postponed to flu season - Pneumovax: Not applicable - HPV: Not applicable  SCREENING: -Mammogram: Up to date  - Colonoscopy: Not applicable   PATIENT COUNSELING:   Advised to take 1 mg of folate supplement per day if capable of pregnancy.   Sexuality: Discussed sexually transmitted diseases, partner selection, use of condoms, avoidance of unintended pregnancy  and contraceptive alternatives.   Advised to avoid cigarette smoking.  I discussed with the patient that most people either abstain from alcohol or drink within safe limits (<=14/week and <=4 drinks/occasion for males, <=7/weeks and <= 3 drinks/occasion for  females) and that the risk for alcohol disorders and other health effects rises proportionally with the number of drinks per week and how often a drinker exceeds daily limits.  Discussed cessation/primary prevention of drug use and availability of treatment for abuse.   Diet: Encouraged to adjust caloric intake to maintain  or achieve ideal body weight, to reduce intake of dietary saturated fat and total fat, to limit sodium intake by avoiding high sodium foods and not adding table salt, and to maintain adequate dietary potassium and calcium preferably from fresh fruits, vegetables, and low-fat dairy products.    stressed the importance of regular exercise  Injury prevention: Discussed safety belts, safety helmets, smoke detector, smoking near bedding or upholstery.   Dental health: Discussed importance of regular tooth brushing, flossing, and dental visits.    NEXT PREVENTATIVE PHYSICAL DUE IN 1 YEAR. Return in about 6 months (around 08/22/2018) for Follow up mood and IFG.

## 2018-02-19 ENCOUNTER — Other Ambulatory Visit: Payer: Self-pay

## 2018-02-19 ENCOUNTER — Encounter: Payer: Self-pay | Admitting: Family Medicine

## 2018-02-19 ENCOUNTER — Encounter: Payer: BC Managed Care – PPO | Admitting: Family Medicine

## 2018-02-19 ENCOUNTER — Ambulatory Visit (INDEPENDENT_AMBULATORY_CARE_PROVIDER_SITE_OTHER): Payer: BC Managed Care – PPO | Admitting: Family Medicine

## 2018-02-19 VITALS — BP 113/77 | HR 73 | Temp 98.6°F | Ht 63.19 in | Wt 213.2 lb

## 2018-02-19 DIAGNOSIS — I10 Essential (primary) hypertension: Secondary | ICD-10-CM | POA: Diagnosis not present

## 2018-02-19 DIAGNOSIS — N951 Menopausal and female climacteric states: Secondary | ICD-10-CM

## 2018-02-19 DIAGNOSIS — E049 Nontoxic goiter, unspecified: Secondary | ICD-10-CM

## 2018-02-19 DIAGNOSIS — M25562 Pain in left knee: Secondary | ICD-10-CM

## 2018-02-19 DIAGNOSIS — F419 Anxiety disorder, unspecified: Secondary | ICD-10-CM

## 2018-02-19 DIAGNOSIS — Z111 Encounter for screening for respiratory tuberculosis: Secondary | ICD-10-CM

## 2018-02-19 DIAGNOSIS — E782 Mixed hyperlipidemia: Secondary | ICD-10-CM | POA: Diagnosis not present

## 2018-02-19 DIAGNOSIS — Z021 Encounter for pre-employment examination: Secondary | ICD-10-CM

## 2018-02-19 DIAGNOSIS — R7303 Prediabetes: Secondary | ICD-10-CM

## 2018-02-19 DIAGNOSIS — Z0000 Encounter for general adult medical examination without abnormal findings: Secondary | ICD-10-CM | POA: Diagnosis not present

## 2018-02-19 DIAGNOSIS — F330 Major depressive disorder, recurrent, mild: Secondary | ICD-10-CM

## 2018-02-19 DIAGNOSIS — Z800 Family history of malignant neoplasm of digestive organs: Secondary | ICD-10-CM

## 2018-02-19 LAB — MICROSCOPIC EXAMINATION
BACTERIA UA: NONE SEEN
RBC, UA: NONE SEEN /hpf (ref 0–2)

## 2018-02-19 LAB — UA/M W/RFLX CULTURE, ROUTINE
Bilirubin, UA: NEGATIVE
Glucose, UA: NEGATIVE
Ketones, UA: NEGATIVE
NITRITE UA: NEGATIVE
Protein, UA: NEGATIVE
RBC, UA: NEGATIVE
Specific Gravity, UA: <1.005 — ABNORMAL LOW (ref 1.005–1.030)
Urobilinogen, Ur: 0.2 mg/dL (ref 0.2–1.0)
pH, UA: 6 (ref 5.0–7.5)

## 2018-02-19 LAB — MICROALBUMIN, URINE WAIVED
Creatinine, Urine Waived: 10 mg/dL (ref 10–300)
Microalb, Ur Waived: 10 mg/L (ref 0–19)

## 2018-02-19 LAB — BAYER DCA HB A1C WAIVED: HB A1C: 5.6 % (ref <7.0)

## 2018-02-19 MED ORDER — BUSPIRONE HCL 15 MG PO TABS: 15.0000 mg | ORAL_TABLET | Freq: Every day | ORAL | 3 refills | Status: DC

## 2018-02-19 MED ORDER — ESCITALOPRAM OXALATE 20 MG PO TABS: 20.0000 mg | ORAL_TABLET | Freq: Every day | ORAL | 1 refills | Status: DC

## 2018-02-19 NOTE — Assessment & Plan Note (Signed)
Under good control. Continue current regimen. Continue to monitor. Refills given today. 

## 2018-02-19 NOTE — Assessment & Plan Note (Signed)
Under good control off medicine. Call with any concerns. Continue to monitor. Call with any concerns.

## 2018-02-19 NOTE — Assessment & Plan Note (Signed)
Checking labs today. Await results. Continue to monitor.  

## 2018-02-19 NOTE — Assessment & Plan Note (Signed)
Under good control with A1c of 5.6. Continue diet and exercise. Call with any concerns.

## 2018-02-19 NOTE — Assessment & Plan Note (Signed)
Rechecking labs today. Await results. Call with any concerns. Continue to monitor.  

## 2018-02-19 NOTE — Patient Instructions (Signed)
Health Maintenance for Postmenopausal Women Menopause is a normal process in which your reproductive ability comes to an end. This process happens gradually over a span of months to years, usually between the ages of 22 and 9. Menopause is complete when you have missed 12 consecutive menstrual periods. It is important to talk with your health care provider about some of the most common conditions that affect postmenopausal women, such as heart disease, cancer, and bone loss (osteoporosis). Adopting a healthy lifestyle and getting preventive care can help to promote your health and wellness. Those actions can also lower your chances of developing some of these common conditions. What should I know about menopause? During menopause, you may experience a number of symptoms, such as:  Moderate-to-severe hot flashes.  Night sweats.  Decrease in sex drive.  Mood swings.  Headaches.  Tiredness.  Irritability.  Memory problems.  Insomnia.  Choosing to treat or not to treat menopausal changes is an individual decision that you make with your health care provider. What should I know about hormone replacement therapy and supplements? Hormone therapy products are effective for treating symptoms that are associated with menopause, such as hot flashes and night sweats. Hormone replacement carries certain risks, especially as you become older. If you are thinking about using estrogen or estrogen with progestin treatments, discuss the benefits and risks with your health care provider. What should I know about heart disease and stroke? Heart disease, heart attack, and stroke become more likely as you age. This may be due, in part, to the hormonal changes that your body experiences during menopause. These can affect how your body processes dietary fats, triglycerides, and cholesterol. Heart attack and stroke are both medical emergencies. There are many things that you can do to help prevent heart disease  and stroke:  Have your blood pressure checked at least every 1-2 years. High blood pressure causes heart disease and increases the risk of stroke.  If you are 53-22 years old, ask your health care provider if you should take aspirin to prevent a heart attack or a stroke.  Do not use any tobacco products, including cigarettes, chewing tobacco, or electronic cigarettes. If you need help quitting, ask your health care provider.  It is important to eat a healthy diet and maintain a healthy weight. ? Be sure to include plenty of vegetables, fruits, low-fat dairy products, and lean protein. ? Avoid eating foods that are high in solid fats, added sugars, or salt (sodium).  Get regular exercise. This is one of the most important things that you can do for your health. ? Try to exercise for at least 150 minutes each week. The type of exercise that you do should increase your heart rate and make you sweat. This is known as moderate-intensity exercise. ? Try to do strengthening exercises at least twice each week. Do these in addition to the moderate-intensity exercise.  Know your numbers.Ask your health care provider to check your cholesterol and your blood glucose. Continue to have your blood tested as directed by your health care provider.  What should I know about cancer screening? There are several types of cancer. Take the following steps to reduce your risk and to catch any cancer development as early as possible. Breast Cancer  Practice breast self-awareness. ? This means understanding how your breasts normally appear and feel. ? It also means doing regular breast self-exams. Let your health care provider know about any changes, no matter how small.  If you are 40  or older, have a clinician do a breast exam (clinical breast exam or CBE) every year. Depending on your age, family history, and medical history, it may be recommended that you also have a yearly breast X-ray (mammogram).  If you  have a family history of breast cancer, talk with your health care provider about genetic screening.  If you are at high risk for breast cancer, talk with your health care provider about having an MRI and a mammogram every year.  Breast cancer (BRCA) gene test is recommended for women who have family members with BRCA-related cancers. Results of the assessment will determine the need for genetic counseling and BRCA1 and for BRCA2 testing. BRCA-related cancers include these types: ? Breast. This occurs in males or females. ? Ovarian. ? Tubal. This may also be called fallopian tube cancer. ? Cancer of the abdominal or pelvic lining (peritoneal cancer). ? Prostate. ? Pancreatic.  Cervical, Uterine, and Ovarian Cancer Your health care provider may recommend that you be screened regularly for cancer of the pelvic organs. These include your ovaries, uterus, and vagina. This screening involves a pelvic exam, which includes checking for microscopic changes to the surface of your cervix (Pap test).  For women ages 21-65, health care providers may recommend a pelvic exam and a Pap test every three years. For women ages 79-65, they may recommend the Pap test and pelvic exam, combined with testing for human papilloma virus (HPV), every five years. Some types of HPV increase your risk of cervical cancer. Testing for HPV may also be done on women of any age who have unclear Pap test results.  Other health care providers may not recommend any screening for nonpregnant women who are considered low risk for pelvic cancer and have no symptoms. Ask your health care provider if a screening pelvic exam is right for you.  If you have had past treatment for cervical cancer or a condition that could lead to cancer, you need Pap tests and screening for cancer for at least 20 years after your treatment. If Pap tests have been discontinued for you, your risk factors (such as having a new sexual partner) need to be  reassessed to determine if you should start having screenings again. Some women have medical problems that increase the chance of getting cervical cancer. In these cases, your health care provider may recommend that you have screening and Pap tests more often.  If you have a family history of uterine cancer or ovarian cancer, talk with your health care provider about genetic screening.  If you have vaginal bleeding after reaching menopause, tell your health care provider.  There are currently no reliable tests available to screen for ovarian cancer.  Lung Cancer Lung cancer screening is recommended for adults 69-62 years old who are at high risk for lung cancer because of a history of smoking. A yearly low-dose CT scan of the lungs is recommended if you:  Currently smoke.  Have a history of at least 30 pack-years of smoking and you currently smoke or have quit within the past 15 years. A pack-year is smoking an average of one pack of cigarettes per day for one year.  Yearly screening should:  Continue until it has been 15 years since you quit.  Stop if you develop a health problem that would prevent you from having lung cancer treatment.  Colorectal Cancer  This type of cancer can be detected and can often be prevented.  Routine colorectal cancer screening usually begins at  age 42 and continues through age 45.  If you have risk factors for colon cancer, your health care provider may recommend that you be screened at an earlier age.  If you have a family history of colorectal cancer, talk with your health care provider about genetic screening.  Your health care provider may also recommend using home test kits to check for hidden blood in your stool.  A small camera at the end of a tube can be used to examine your colon directly (sigmoidoscopy or colonoscopy). This is done to check for the earliest forms of colorectal cancer.  Direct examination of the colon should be repeated every  5-10 years until age 71. However, if early forms of precancerous polyps or small growths are found or if you have a family history or genetic risk for colorectal cancer, you may need to be screened more often.  Skin Cancer  Check your skin from head to toe regularly.  Monitor any moles. Be sure to tell your health care provider: ? About any new moles or changes in moles, especially if there is a change in a mole's shape or color. ? If you have a mole that is larger than the size of a pencil eraser.  If any of your family members has a history of skin cancer, especially at a young age, talk with your health care provider about genetic screening.  Always use sunscreen. Apply sunscreen liberally and repeatedly throughout the day.  Whenever you are outside, protect yourself by wearing long sleeves, pants, a wide-brimmed hat, and sunglasses.  What should I know about osteoporosis? Osteoporosis is a condition in which bone destruction happens more quickly than new bone creation. After menopause, you may be at an increased risk for osteoporosis. To help prevent osteoporosis or the bone fractures that can happen because of osteoporosis, the following is recommended:  If you are 46-71 years old, get at least 1,000 mg of calcium and at least 600 mg of vitamin D per day.  If you are older than age 55 but younger than age 65, get at least 1,200 mg of calcium and at least 600 mg of vitamin D per day.  If you are older than age 54, get at least 1,200 mg of calcium and at least 800 mg of vitamin D per day.  Smoking and excessive alcohol intake increase the risk of osteoporosis. Eat foods that are rich in calcium and vitamin D, and do weight-bearing exercises several times each week as directed by your health care provider. What should I know about how menopause affects my mental health? Depression may occur at any age, but it is more common as you become older. Common symptoms of depression  include:  Low or sad mood.  Changes in sleep patterns.  Changes in appetite or eating patterns.  Feeling an overall lack of motivation or enjoyment of activities that you previously enjoyed.  Frequent crying spells.  Talk with your health care provider if you think that you are experiencing depression. What should I know about immunizations? It is important that you get and maintain your immunizations. These include:  Tetanus, diphtheria, and pertussis (Tdap) booster vaccine.  Influenza every year before the flu season begins.  Pneumonia vaccine.  Shingles vaccine.  Your health care provider may also recommend other immunizations. This information is not intended to replace advice given to you by your health care provider. Make sure you discuss any questions you have with your health care provider. Document Released: 09/07/2005  Document Revised: 02/03/2016 Document Reviewed: 04/19/2015 Elsevier Interactive Patient Education  2018 [employer].  Knee Exercises Ask your health care provider which exercises are safe for you. Do exercises exactly as told by your health care provider and adjust them as directed. It is normal to feel mild stretching, pulling, tightness, or discomfort as you do these exercises, but you should stop right away if you feel sudden pain or your pain gets worse.Do not begin these exercises until told by your health care provider. STRETCHING AND RANGE OF MOTION EXERCISES These exercises warm up your muscles and joints and improve the movement and flexibility of your knee. These exercises also help to relieve pain, numbness, and tingling. Exercise A: Knee Extension, Prone 1. Lie on your abdomen on a bed. 2. Place your left / right knee just beyond the edge of the surface so your knee is not on the bed. You can put a towel under your left / right thigh just above your knee for comfort. 3. Relax your leg muscles and allow gravity to straighten your knee. You  should feel a stretch behind your left / right knee. 4. Hold this position for __________ seconds. 5. Scoot up so your knee is supported between repetitions. Repeat __________ times. Complete this stretch __________ times a day. Exercise B: Knee Flexion, Active  1. Lie on your back with both knees straight. If this causes back discomfort, bend your left / right knee so your foot is flat on the floor. 2. Slowly slide your left / right heel back toward your buttocks until you feel a gentle stretch in the front of your knee or thigh. 3. Hold this position for __________ seconds. 4. Slowly slide your left / right heel back to the starting position. Repeat __________ times. Complete this exercise __________ times a day. Exercise C: Quadriceps, Prone  1. Lie on your abdomen on a firm surface, such as a bed or padded floor. 2. Bend your left / right knee and hold your ankle. If you cannot reach your ankle or pant leg, loop a belt around your foot and grab the belt instead. 3. Gently pull your heel toward your buttocks. Your knee should not slide out to the side. You should feel a stretch in the front of your thigh and knee. 4. Hold this position for __________ seconds. Repeat __________ times. Complete this stretch __________ times a day. Exercise D: Hamstring, Supine 1. Lie on your back. 2. Loop a belt or towel over the ball of your left / right foot. The ball of your foot is on the walking surface, right under your toes. 3. Straighten your left / right knee and slowly pull on the belt to raise your leg until you feel a gentle stretch behind your knee. ? Do not let your left / right knee bend while you do this. ? Keep your other leg flat on the floor. 4. Hold this position for __________ seconds. Repeat __________ times. Complete this stretch __________ times a day. STRENGTHENING EXERCISES These exercises build strength and endurance in your knee. Endurance is the ability to use your muscles for  a long time, even after they get tired. Exercise E: Quadriceps, Isometric  1. Lie on your back with your left / right leg extended and your other knee bent. Put a rolled towel or small pillow under your knee if told by your health care provider. 2. Slowly tense the muscles in the front of your left / right thigh. You should see  your kneecap slide up toward your hip or see increased dimpling just above the knee. This motion will push the back of the knee toward the floor. 3. For __________ seconds, keep the muscle as tight as you can without increasing your pain. 4. Relax the muscles slowly and completely. Repeat __________ times. Complete this exercise __________ times a day. Exercise F: Straight Leg Raises - Quadriceps 1. Lie on your back with your left / right leg extended and your other knee bent. 2. Tense the muscles in the front of your left / right thigh. You should see your kneecap slide up or see increased dimpling just above the knee. Your thigh may even shake a bit. 3. Keep these muscles tight as you raise your leg 4-6 inches (10-15 cm) off the floor. Do not let your knee bend. 4. Hold this position for __________ seconds. 5. Keep these muscles tense as you lower your leg. 6. Relax your muscles slowly and completely after each repetition. Repeat __________ times. Complete this exercise __________ times a day. Exercise G: Hamstring, Isometric 1. Lie on your back on a firm surface. 2. Bend your left / right knee approximately __________ degrees. 3. Dig your left / right heel into the surface as if you are trying to pull it toward your buttocks. Tighten the muscles in the back of your thighs to dig as hard as you can without increasing any pain. 4. Hold this position for __________ seconds. 5. Release the tension gradually and allow your muscles to relax completely for __________ seconds after each repetition. Repeat __________ times. Complete this exercise __________ times a  day. Exercise H: Hamstring Curls  If told by your health care provider, do this exercise while wearing ankle weights. Begin with __________ weights. Then increase the weight by 1 lb (0.5 kg) increments. Do not wear ankle weights that are more than __________. 1. Lie on your abdomen with your legs straight. 2. Bend your left / right knee as far as you can without feeling pain. Keep your hips flat against the floor. 3. Hold this position for __________ seconds. 4. Slowly lower your leg to the starting position.  Repeat __________ times. Complete this exercise __________ times a day. Exercise I: Squats (Quadriceps) 1. Stand in front of a table, with your feet and knees pointing straight ahead. You may rest your hands on the table for balance but not for support. 2. Slowly bend your knees and lower your hips like you are going to sit in a chair. ? Keep your weight over your heels, not over your toes. ? Keep your lower legs upright so they are parallel with the table legs. ? Do not let your hips go lower than your knees. ? Do not bend lower than told by your health care provider. ? If your knee pain increases, do not bend as low. 3. Hold the squat position for __________ seconds. 4. Slowly push with your legs to return to standing. Do not use your hands to pull yourself to standing. Repeat __________ times. Complete this exercise __________ times a day. Exercise J: Wall Slides (Quadriceps)  1. Lean your back against a smooth wall or door while you walk your feet out 18-24 inches (46-61 cm) from it. 2. Place your feet hip-width apart. 3. Slowly slide down the wall or door until your knees bend __________ degrees. Keep your knees over your heels, not over your toes. Keep your knees in line with your hips. 4. Hold for __________ seconds. Repeat  __________ times. Complete this exercise __________ times a day. Exercise K: Straight Leg Raises - Hip Abductors 1. Lie on your side with your left /  right leg in the top position. Lie so your head, shoulder, knee, and hip line up. You may bend your bottom knee to help you keep your balance. 2. Roll your hips slightly forward so your hips are stacked directly over each other and your left / right knee is facing forward. 3. Leading with your heel, lift your top leg 4-6 inches (10-15 cm). You should feel the muscles in your outer hip lifting. ? Do not let your foot drift forward. ? Do not let your knee roll toward the ceiling. 4. Hold this position for __________ seconds. 5. Slowly return your leg to the starting position. 6. Let your muscles relax completely after each repetition. Repeat __________ times. Complete this exercise __________ times a day. Exercise L: Straight Leg Raises - Hip Extensors 1. Lie on your abdomen on a firm surface. You can put a pillow under your hips if that is more comfortable. 2. Tense the muscles in your buttocks and lift your left / right leg about 4-6 inches (10-15 cm). Keep your knee straight as you lift your leg. 3. Hold this position for __________ seconds. 4. Slowly lower your leg to the starting position. 5. Let your leg relax completely after each repetition. Repeat __________ times. Complete this exercise __________ times a day. This information is not intended to replace advice given to you by your health care provider. Make sure you discuss any questions you have with your health care provider. Document Released: 05/30/2005 Document Revised: 04/09/2016 Document Reviewed: 05/22/2015 Elsevier Interactive Patient Education  2018 [employer].

## 2018-02-20 ENCOUNTER — Encounter: Payer: Self-pay | Admitting: Family Medicine

## 2018-02-20 ENCOUNTER — Other Ambulatory Visit: Payer: Self-pay | Admitting: Family Medicine

## 2018-02-20 DIAGNOSIS — Z789 Other specified health status: Secondary | ICD-10-CM

## 2018-02-20 LAB — COMPREHENSIVE METABOLIC PANEL
ALK PHOS: 108 IU/L (ref 39–117)
ALT: 28 IU/L (ref 0–32)
AST: 18 IU/L (ref 0–40)
Albumin/Globulin Ratio: 1.9 (ref 1.2–2.2)
Albumin: 5 g/dL (ref 3.5–5.5)
BUN/Creatinine Ratio: 23 (ref 9–23)
BUN: 18 mg/dL (ref 6–24)
Bilirubin Total: 0.4 mg/dL (ref 0.0–1.2)
CALCIUM: 9.7 mg/dL (ref 8.7–10.2)
CO2: 24 mmol/L (ref 20–29)
CREATININE: 0.78 mg/dL (ref 0.57–1.00)
Chloride: 100 mmol/L (ref 96–106)
GFR calc Af Amer: 104 mL/min/{1.73_m2} (ref >59)
GFR, EST NON AFRICAN AMERICAN: 90 mL/min/{1.73_m2} (ref >59)
Globulin, Total: 2.6 g/dL (ref 1.5–4.5)
Glucose: 86 mg/dL (ref 65–99)
Potassium: 4.6 mmol/L (ref 3.5–5.2)
SODIUM: 141 mmol/L (ref 134–144)
Total Protein: 7.6 g/dL (ref 6.0–8.5)

## 2018-02-20 LAB — MEASLES/MUMPS/RUBELLA IMMUNITY
MUMPS ABS, IGG: 201 AU/mL (ref >10.9)
RUBEOLA AB, IGG: 51.4 AU/mL (ref >29.9)
Rubella Antibodies, IGG: 4.41 index (ref >0.99)

## 2018-02-20 LAB — FOLLICLE STIMULATING HORMONE: FSH: 62.7 m[IU]/mL

## 2018-02-20 LAB — CBC WITH DIFFERENTIAL/PLATELET
Basophils Absolute: 0.1 10*3/uL (ref 0.0–0.2)
Basos: 1 %
EOS (ABSOLUTE): 0.2 10*3/uL (ref 0.0–0.4)
EOS: 2 %
HEMATOCRIT: 46.4 % (ref 34.0–46.6)
HEMOGLOBIN: 15.1 g/dL (ref 11.1–15.9)
Immature Grans (Abs): 0 10*3/uL (ref 0.0–0.1)
Immature Granulocytes: 0 %
LYMPHS ABS: 2.8 10*3/uL (ref 0.7–3.1)
Lymphs: 40 %
MCH: 30.3 pg (ref 26.6–33.0)
MCHC: 32.5 g/dL (ref 31.5–35.7)
MCV: 93 fL (ref 79–97)
MONOCYTES: 7 %
MONOS ABS: 0.5 10*3/uL (ref 0.1–0.9)
Neutrophils Absolute: 3.5 10*3/uL (ref 1.4–7.0)
Neutrophils: 50 %
Platelets: 211 10*3/uL (ref 150–450)
RBC: 4.99 x10E6/uL (ref 3.77–5.28)
RDW: 13.5 % (ref 12.3–15.4)
WBC: 7 10*3/uL (ref 3.4–10.8)

## 2018-02-20 LAB — LUTEINIZING HORMONE: LH: 43.5 m[IU]/mL

## 2018-02-20 LAB — TSH: TSH: 0.997 u[IU]/mL (ref 0.450–4.500)

## 2018-02-20 LAB — LIPID PANEL W/O CHOL/HDL RATIO
CHOLESTEROL TOTAL: 239 mg/dL — AB (ref 100–199)
HDL: 55 mg/dL (ref >39)
LDL Calculated: 155 mg/dL — ABNORMAL HIGH (ref 0–99)
TRIGLYCERIDES: 146 mg/dL (ref 0–149)
VLDL Cholesterol Cal: 29 mg/dL (ref 5–40)

## 2018-02-20 LAB — ESTRADIOL: ESTRADIOL: 10.6 pg/mL

## 2018-02-20 LAB — HEPATITIS B SURFACE ANTIBODY, QUANTITATIVE

## 2018-02-20 NOTE — Progress Notes (Signed)
Hep b- not immune

## 2018-02-23 LAB — QUANTIFERON-TB GOLD PLUS
QUANTIFERON TB1 AG VALUE: 0.08 [IU]/mL
QuantiFERON Mitogen Value: >10 IU/mL
QuantiFERON Nil Value: 0.03 IU/mL
QuantiFERON TB2 Ag Value: 0.09 IU/mL
QuantiFERON-TB Gold Plus: NEGATIVE

## 2018-03-04 ENCOUNTER — Encounter: Payer: Self-pay | Admitting: *Deleted

## 2018-05-26 ENCOUNTER — Ambulatory Visit: Payer: Self-pay | Admitting: *Deleted

## 2018-05-26 NOTE — Telephone Encounter (Signed)
Pt reports "Pinkish" urine, onset Friday but resolved, noted again this AM. States occurred with 1 voiding episode  this am. Reports lower back "Dull" pain, 3-4/10. Does not radiate, constant. Afebrile. Pt had secured appt for Thursday via MyChart; triaged and appt made for tomorrow AM with R. Orene Desanctis.  Care advise given per protocol; instructed to go to UC/ED if back pain worsens, febrile, increased bleeding/pain.   Reason for Disposition . Side (flank) or back pain present  Answer Assessment - Initial Assessment Questions 1. COLOR of URINE: "Describe the color of the urine."  (e.g., tea-colored, pink, red, blood clots, bloody)    Light pink, no clots 2. ONSET: "When did the bleeding start?"     Friday,  noted again this AM, slight, pinkish 3. EPISODES: "How many times has there been blood in the urine?" or "How many times today?"     1 time 4. PAIN with URINATION: "Is there any pain with passing your urine?" If so, ask: "How bad is the pain?"  (Scale 1-10; or mild, moderate, severe)    - MILD - complains slightly about urination hurting    - MODERATE - interferes with normal activities      - SEVERE - excruciating, unwilling or unable to urinate because of the pain      Lower back pain, dull, across back 3-4/10 5. FEVER: "Do you have a fever?" If so, ask: "What is your temperature, how was it measured, and when did it start?"     no 6. ASSOCIATED SYMPTOMS: "Are you passing urine more frequently than usual?"     Frequency 7. OTHER SYMPTOMS: "Do you have any other symptoms?" (e.g., back/flank pain, abdominal pain, vomiting)     Back pain 3-4/10  Protocols used: URINE - BLOOD IN-A-AH

## 2018-05-27 ENCOUNTER — Encounter: Payer: Self-pay | Admitting: Family Medicine

## 2018-05-27 ENCOUNTER — Ambulatory Visit
Admission: RE | Admit: 2018-05-27 | Discharge: 2018-05-27 | Disposition: A | Discharge status: Home / Self Care | Payer: BC Managed Care – PPO | Source: Ambulatory Visit | Attending: Family Medicine | Admitting: Family Medicine

## 2018-05-27 ENCOUNTER — Ambulatory Visit: Payer: BC Managed Care – PPO | Admitting: Family Medicine

## 2018-05-27 VITALS — BP 113/76 | HR 66 | Temp 98.2°F | Ht 62.0 in | Wt 201.0 lb

## 2018-05-27 DIAGNOSIS — R1032 Left lower quadrant pain: Secondary | ICD-10-CM | POA: Diagnosis present

## 2018-05-27 DIAGNOSIS — N390 Urinary tract infection, site not specified: Secondary | ICD-10-CM | POA: Diagnosis not present

## 2018-05-27 LAB — CBC WITH DIFFERENTIAL/PLATELET
Hematocrit: 44.4 % (ref 34.0–46.6)
Hemoglobin: 15.1 g/dL (ref 11.1–15.9)
LYMPHS ABS: 3 10*3/uL (ref 0.7–3.1)
Lymphs: 42 %
MCH: 31 pg (ref 26.6–33.0)
MCHC: 34 g/dL (ref 31.5–35.7)
MCV: 91 fL (ref 79–97)
MID (Absolute): 0.6 10*3/uL (ref 0.1–1.6)
MID: 8 %
NEUTROS ABS: 3.5 10*3/uL (ref 1.4–7.0)
Neutrophils: 49 %
PLATELETS: 193 10*3/uL (ref 150–450)
RBC: 4.87 x10E6/uL (ref 3.77–5.28)
RDW: 13.7 % (ref 12.3–15.4)
WBC: 7.1 10*3/uL (ref 3.4–10.8)

## 2018-05-27 MED ORDER — TAMSULOSIN HCL 0.4 MG PO CAPS: 0.4000 mg | ORAL_CAPSULE | Freq: Every day | ORAL | 1 refills | Status: DC

## 2018-05-27 MED ORDER — CIPROFLOXACIN HCL 250 MG PO TABS: 250.0000 mg | ORAL_TABLET | Freq: Two times a day (BID) | ORAL | 0 refills | Status: DC

## 2018-05-27 MED ORDER — TRAMADOL HCL 50 MG PO TABS: 50.0000 mg | ORAL_TABLET | Freq: Three times a day (TID) | ORAL | 0 refills | Status: DC | PRN

## 2018-05-27 NOTE — Progress Notes (Signed)
BP 113/76 (BP Location: Left Arm, Patient Position: Sitting, Cuff Size: Normal)   Pulse 66   Temp 98.2 F (36.8 C) (Oral)   Ht 5\' 2"  (1.575 m)   Wt 201 lb (91.2 kg)   SpO2 99%   BMI 36.76 kg/m    Subjective:    Patient ID: Paula Clay, female    DOB: Feb 01, 1971, 47 y.o.   MRN: [id number]  HPI: Paula Clay is a 47 y.o. female  Chief Complaint  Patient presents with  . Flank Pain    Patient states bilateral flank pain. Thinks it's a kidney stone. Ongoing 3-4 days.   . Hematuria  . Urinary Frequency   Pink discharge after urination and intermittent b/l flank pain x 4-5 days. Last night pain worsened significantly and now having nausea. Hx of kidney stones, states this feels similar. No fevers, chills, sweats, dysuria, urinary frequency, diarrhea, melena. Not taking anything for sxs.   Relevant past medical, surgical, family and social history reviewed and updated as indicated. Interim medical history since our last visit reviewed. Allergies and medications reviewed and updated.  Review of Systems  Per HPI unless specifically indicated above     Objective:    BP 113/76 (BP Location: Left Arm, Patient Position: Sitting, Cuff Size: Normal)   Pulse 66   Temp 98.2 F (36.8 C) (Oral)   Ht 5\' 2"  (1.575 m)   Wt 201 lb (91.2 kg)   SpO2 99%   BMI 36.76 kg/m   Wt Readings from Last 3 Encounters:  05/27/18 201 lb (91.2 kg)  02/19/18 213 lb 3.2 oz (96.7 kg)  08/29/17 213 lb 3 oz (96.7 kg)    Physical Exam  Constitutional: She is oriented to person, place, and time. She appears well-developed and well-nourished. No distress.  HENT:  Head: Atraumatic.  Eyes: Conjunctivae and EOM are normal.  Neck: Normal range of motion. Neck supple.  Cardiovascular: Normal rate and regular rhythm.  Pulmonary/Chest: Effort normal and breath sounds normal.  Abdominal: Soft. Bowel sounds are normal. She exhibits no mass. There is tenderness (mild lower abdominal ttp, L > R).  There is no rebound and no guarding.  Musculoskeletal: Normal range of motion.  Neurological: She is alert and oriented to person, place, and time.  Skin: Skin is warm and dry.  Psychiatric: She has a normal mood and affect. Her behavior is normal.  Nursing note and vitals reviewed.   Results for orders placed or performed in visit on 05/27/18  Microscopic Examination  Result Value Ref Range   WBC, UA 11-30 (A) 0 - 5 /hpf   RBC, UA None seen 0 - 2 /hpf   Epithelial Cells (non renal) 0-10 0 - 10 /hpf   Bacteria, UA Moderate (A) None seen/Few  Urine Culture, Reflex  Result Value Ref Range   Urine Culture, Routine CANCELED   Urine Culture  Result Value Ref Range   Urine Culture, Routine Final report    Organism ID, Bacteria Comment   UA/M w/rflx Culture, Routine  Result Value Ref Range   Specific Gravity, UA <1.005 (L) 1.005 - 1.030   pH, UA 7.0 5.0 - 7.5   Color, UA Yellow Yellow   Appearance Ur Cloudy (A) Clear   Leukocytes, UA 3+ (A) Negative   Protein, UA Negative Negative/Trace   Glucose, UA Negative Negative   Ketones, UA Negative Negative   RBC, UA Trace (A) Negative   Bilirubin, UA Negative Negative   Urobilinogen, Ur 0.2 0.2 -  1.0 mg/dL   Nitrite, UA Negative Negative   Microscopic Examination See below:    Urinalysis Reflex Comment   CBC With Differential/Platelet  Result Value Ref Range   WBC 7.1 3.4 - 10.8 x10E3/uL   RBC 4.87 3.77 - 5.28 x10E6/uL   Hemoglobin 15.1 11.1 - 15.9 g/dL   Hematocrit 44.4 34.0 - 46.6 %   MCV 91 79 - 97 fL   MCH 31.0 26.6 - 33.0 pg   MCHC 34.0 31.5 - 35.7 g/dL   RDW 13.7 12.3 - 15.4 %   Platelets 193 150 - 450 x10E3/uL   Neutrophils 49 Not Estab. %   Lymphs 42 Not Estab. %   MID 8 Not Estab. %   Neutrophils Absolute 3.5 1.4 - 7.0 x10E3/uL   Lymphocytes Absolute 3.0 0.7 - 3.1 x10E3/uL   MID (Absolute) 0.6 0.1 - 1.6 X10E3/uL      Assessment & Plan:   Problem List Items Addressed This Visit    None    Visit Diagnoses     Acute lower UTI    -  Primary   Relevant Orders   UA/M w/rflx Culture, Routine (Completed)   Urine Culture, Reflex (Completed)   Urine Culture (Completed)   LLQ pain       Relevant Orders   DG Abd 2 Views (Completed)   CBC With Differential/Platelet (Completed)    Vitals benign, exam notable only for some mild lower abdominal ttp. WBC count WNL making diverticulitis, appendicitis, pyelonephritis unlikely. U/A showing bacteria in urine. Will tx with cipro and await cx. Given her pain sxs, will get KUB and see if stones are noted. May need f/u CT if not improving.  Small supply of tramadol given for severe pain episodes and started on flomax in case stones. Return precautions given.   Follow up plan: Return if symptoms worsen or fail to improve.

## 2018-05-28 ENCOUNTER — Telehealth: Payer: Self-pay | Admitting: Family Medicine

## 2018-05-28 ENCOUNTER — Ambulatory Visit: Payer: BC Managed Care – PPO | Admitting: Family Medicine

## 2018-05-28 NOTE — Telephone Encounter (Signed)
Called pt, reviewed benign x-ray and CBC. Awaiting urine culture. Pt feeling about the same, states the tramadol is not helping much with her pain. Advised her to add ibuprofen and tylenol prn. Return precautions given, she will go to ER if significantly worsening and if not getting better will call back

## 2018-05-29 LAB — UA/M W/RFLX CULTURE, ROUTINE
Bilirubin, UA: NEGATIVE
Glucose, UA: NEGATIVE
Ketones, UA: NEGATIVE
NITRITE UA: NEGATIVE
PH UA: 7 (ref 5.0–7.5)
PROTEIN UA: NEGATIVE
Specific Gravity, UA: <1.005 — ABNORMAL LOW (ref 1.005–1.030)
Urobilinogen, Ur: 0.2 mg/dL (ref 0.2–1.0)

## 2018-05-29 LAB — MICROSCOPIC EXAMINATION: RBC MICROSCOPIC, UA: NONE SEEN /HPF (ref 0–2)

## 2018-05-29 LAB — URINE CULTURE

## 2018-05-29 LAB — URINE CULTURE, REFLEX

## 2018-05-29 NOTE — Patient Instructions (Signed)
Follow up as needed

## 2018-06-02 ENCOUNTER — Encounter: Payer: Self-pay | Admitting: Family Medicine

## 2018-06-02 ENCOUNTER — Telehealth: Payer: Self-pay | Admitting: Family Medicine

## 2018-06-02 NOTE — Telephone Encounter (Unsigned)
Copied from [place] [id number]. Topic: General - Other >> Jun 02, 2018 10:34 AM Ivar Drape wrote: Reason for CRM:  Patient was seen at the practice last Tuesday for an UTI and Kidney Stones.  She has finished the antibiotics but she is still experiencing blood when she urinates, but it does not hurt to urinate.  She does not have the back pain anymore but she is still experiencing pressure in the front on the lower left side. Please advise.

## 2018-06-16 ENCOUNTER — Encounter: Payer: Self-pay | Admitting: Family Medicine

## 2018-06-16 NOTE — Telephone Encounter (Signed)
Has this been completed?

## 2018-06-23 ENCOUNTER — Encounter: Payer: Self-pay | Admitting: Family Medicine

## 2018-06-24 ENCOUNTER — Encounter: Payer: Self-pay | Admitting: Family Medicine

## 2018-06-25 ENCOUNTER — Ambulatory Visit: Payer: BC Managed Care – PPO | Admitting: Family Medicine

## 2018-06-25 ENCOUNTER — Encounter: Payer: Self-pay | Admitting: Family Medicine

## 2018-06-25 VITALS — BP 125/80 | HR 93 | Temp 98.2°F | Wt 201.4 lb

## 2018-06-25 DIAGNOSIS — Z23 Encounter for immunization: Secondary | ICD-10-CM | POA: Diagnosis not present

## 2018-06-25 DIAGNOSIS — R319 Hematuria, unspecified: Secondary | ICD-10-CM

## 2018-06-25 DIAGNOSIS — R1030 Lower abdominal pain, unspecified: Secondary | ICD-10-CM | POA: Diagnosis not present

## 2018-06-25 DIAGNOSIS — N939 Abnormal uterine and vaginal bleeding, unspecified: Secondary | ICD-10-CM | POA: Diagnosis not present

## 2018-06-25 LAB — UA/M W/RFLX CULTURE, ROUTINE
Bilirubin, UA: NEGATIVE
GLUCOSE, UA: NEGATIVE
LEUKOCYTES UA: NEGATIVE
Nitrite, UA: NEGATIVE
PH UA: 6.5 (ref 5.0–7.5)
Specific Gravity, UA: 1.02 (ref 1.005–1.030)
Urobilinogen, Ur: 2 mg/dL — ABNORMAL HIGH (ref 0.2–1.0)

## 2018-06-25 LAB — MICROSCOPIC EXAMINATION: Bacteria, UA: NONE SEEN

## 2018-06-25 MED ORDER — ESTRADIOL 0.1 MG/GM VA CREA: 1.0000 | TOPICAL_CREAM | Freq: Every day | VAGINAL | 1 refills | Status: DC

## 2018-06-25 NOTE — Progress Notes (Signed)
BP 125/80   Pulse 93   Temp 98.2 F (36.8 C) (Oral)   Wt 201 lb 6.4 oz (91.4 kg)   SpO2 96%   BMI 36.84 kg/m    Subjective:    Patient ID: Paula Clay, female    DOB: Oct 02, 1970, 47 y.o.   MRN: [id number]  HPI: Paula Clay is a 47 y.o. female  Chief Complaint  Patient presents with  . Urinary Tract Infection    pt states she has still been having on and off blood in her urine since last visit   Here today with intermittent recurrent lower abdominal pain, hematuria, and nausea. Was treated last week for a UTI and possible kidney stones but noticed minimal difference on antibiotics, flomax, and tramadol prn. No fevers, chills, new low back pain, dysuria, discharge. States she's been having to wear a pantiliner pad and is having a moderate amount of blood on it during these days where she's having pain.  Hx of suspected kidney stones and menorrhagia s/p total hysterectomy and BSO. Not sexually active.   Relevant past medical, surgical, family and social history reviewed and updated as indicated. Interim medical history since our last visit reviewed. Allergies and medications reviewed and updated.  Review of Systems  Per HPI unless specifically indicated above     Objective:    BP 125/80   Pulse 93   Temp 98.2 F (36.8 C) (Oral)   Wt 201 lb 6.4 oz (91.4 kg)   SpO2 96%   BMI 36.84 kg/m   Wt Readings from Last 3 Encounters:  06/25/18 201 lb 6.4 oz (91.4 kg)  05/27/18 201 lb (91.2 kg)  02/19/18 213 lb 3.2 oz (96.7 kg)    Physical Exam  Constitutional: She is oriented to person, place, and time. She appears well-developed and well-nourished. No distress.  HENT:  Head: Atraumatic.  Eyes: Conjunctivae and EOM are normal.  Neck: Normal range of motion. Neck supple.  Cardiovascular: Normal rate, regular rhythm and normal heart sounds.  Pulmonary/Chest: Effort normal and breath sounds normal.  Abdominal: Soft. Bowel sounds are normal. She exhibits no  distension and no mass. There is tenderness (mild ttp suprapubic area). There is no rebound and no guarding.  Genitourinary:  Genitourinary Comments: Vaginal mucosa appears normal, but some mild active bleeding from canal wall during speculum exam and patient reports pain with speculum insertion. No masses or visible tears within vaginal canal. No abnormal discharge or rashes.   Musculoskeletal: Normal range of motion. She exhibits no tenderness (No CVA ttp b/l).  Neurological: She is alert and oriented to person, place, and time.  Skin: Skin is warm and dry.  Psychiatric: She has a normal mood and affect. Her behavior is normal. Thought content normal.  Nursing note and vitals reviewed.   Results for orders placed or performed in visit on 06/25/18  Microscopic Examination  Result Value Ref Range   WBC, UA 0-5 0 - 5 /hpf   RBC, UA 11-30 (A) 0 - 2 /hpf   Epithelial Cells (non renal) 0-10 0 - 10 /hpf   Bacteria, UA None seen None seen/Few  UA/M w/rflx Culture, Routine  Result Value Ref Range   Specific Gravity, UA 1.020 1.005 - 1.030   pH, UA 6.5 5.0 - 7.5   Color, UA Orange Yellow   Appearance Ur Cloudy (A) Clear   Leukocytes, UA Negative Negative   Protein, UA Trace (A) Negative/Trace   Glucose, UA Negative Negative   Ketones, UA  1+ (A) Negative   RBC, UA 3+ (A) Negative   Bilirubin, UA Negative Negative   Urobilinogen, Ur 2.0 (H) 0.2 - 1.0 mg/dL   Nitrite, UA Negative Negative   Microscopic Examination See below:       Assessment & Plan:   Problem List Items Addressed This Visit    None    Visit Diagnoses    Hematuria, unspecified type    -  Primary   Relevant Orders   UA/M w/rflx Culture, Routine (Completed)   Lower abdominal pain       Relevant Orders   CT RENAL STONE STUDY   Vaginal bleeding       Relevant Orders   Ambulatory referral to Gynecology   Need for influenza vaccination       Relevant Orders   Flu Vaccine QUAD 36+ mos IM (Completed)   Need for Tdap  vaccination       Relevant Orders   Tdap vaccine greater than or equal to 7yo IM (Completed)    UTI appears resolved, but sxs returned. When asked to differentiate urethral bleeding vs vaginal bleeding, pt begins to wonder if most of the bleeding is vaginal. Will refer to GYN for further workup given visualized vaginal bleeding s/p total hysterectomy and BSO 2 years ago. Will start estrace vaginal cream in meantime in case lining friable due to hormone issues post surgical menopause. Will also obtain CT to check for kidney stones as she's never had confirmatory imaging and x-ray last week did not show any stones. F/u precautions given with ER precautions reviewed. Push fluids.    Follow up plan: Return if symptoms worsen or fail to improve.

## 2018-06-25 NOTE — Patient Instructions (Addendum)
Influenza (Flu) Vaccine (Inactivated or Recombinant): What You Need to Know 1. Why get vaccinated? Influenza ("flu") is a contagious disease that spreads around the [place] every year, usually between October and May. Flu is caused by influenza viruses, and is spread mainly by coughing, sneezing, and close contact. Anyone can get flu. Flu strikes suddenly and can last several days. Symptoms vary by age, but can include:  fever/chills  sore throat  muscle aches  fatigue  cough  headache  runny or stuffy nose  Flu can also lead to pneumonia and blood infections, and cause diarrhea and seizures in children. If you have a medical condition, such as heart or lung disease, flu can make it worse. Flu is more dangerous for some people. Infants and young children, people 23 years of age and older, pregnant women, and people with certain health conditions or a weakened immune system are at greatest risk. Each year thousands of people in the [place] die from flu, and many more are hospitalized. Flu vaccine can:  keep you from getting flu,  make flu less severe if you do get it, and  keep you from spreading flu to your family and other people. 2. Inactivated and recombinant flu vaccines A dose of flu vaccine is recommended every flu season. Children 6 months through 91 years of age may need two doses during the same flu season. Everyone else needs only one dose each flu season. Some inactivated flu vaccines contain a very small amount of a mercury-based preservative called thimerosal. Studies have not shown thimerosal in vaccines to be harmful, but flu vaccines that do not contain thimerosal are available. There is no live flu virus in flu shots. They cannot cause the flu. There are many flu viruses, and they are always changing. Each year a new flu vaccine is made to protect against three or four viruses that are likely to cause disease in the upcoming flu season. But even when the  vaccine doesn't exactly match these viruses, it may still provide some protection. Flu vaccine cannot prevent:  flu that is caused by a virus not covered by the vaccine, or  illnesses that look like flu but are not.  It takes about 2 weeks for protection to develop after vaccination, and protection lasts through the flu season. 3. Some people should not get this vaccine Tell the person who is giving you the vaccine:  If you have any severe, life-threatening allergies. If you ever had a life-threatening allergic reaction after a dose of flu vaccine, or have a severe allergy to any part of this vaccine, you may be advised not to get vaccinated. Most, but not all, types of flu vaccine contain a small amount of egg protein.  If you ever had Guillain-Barr Syndrome (also called GBS). Some people with a history of GBS should not get this vaccine. This should be discussed with your doctor.  If you are not feeling well. It is usually okay to get flu vaccine when you have a mild illness, but you might be asked to come back when you feel better.  4. Risks of a vaccine reaction With any medicine, including vaccines, there is a chance of reactions. These are usually mild and go away on their own, but serious reactions are also possible. Most people who get a flu shot do not have any problems with it. Minor problems following a flu shot include:  soreness, redness, or swelling where the shot was given  hoarseness  sore,  red or itchy eyes  cough  fever  aches  headache  itching  fatigue  If these problems occur, they usually begin soon after the shot and last 1 or 2 days. More serious problems following a flu shot can include the following:  There may be a small increased risk of Guillain-Barre Syndrome (GBS) after inactivated flu vaccine. This risk has been estimated at 1 or 2 additional cases per million people vaccinated. This is much lower than the risk of severe complications from  flu, which can be prevented by flu vaccine.  Young children who get the flu shot along with pneumococcal vaccine (PCV13) and/or DTaP vaccine at the same time might be slightly more likely to have a seizure caused by fever. Ask your doctor for more information. Tell your doctor if a child who is getting flu vaccine has ever had a seizure.  Problems that could happen after any injected vaccine:  People sometimes faint after a medical procedure, including vaccination. Sitting or lying down for about 15 minutes can help prevent fainting, and injuries caused by a fall. Tell your doctor if you feel dizzy, or have vision changes or ringing in the ears.  Some people get severe pain in the shoulder and have difficulty moving the arm where a shot was given. This happens very rarely.  Any medication can cause a severe allergic reaction. Such reactions from a vaccine are very rare, estimated at about 1 in a million doses, and would happen within a few minutes to a few hours after the vaccination. As with any medicine, there is a very remote chance of a vaccine causing a serious injury or death. The safety of vaccines is always being monitored. For more information, visit: http://www.aguilar.org/ 5. What if there is a serious reaction? What should I look for? Look for anything that concerns you, such as signs of a severe allergic reaction, very high fever, or unusual behavior. Signs of a severe allergic reaction can include hives, swelling of the face and throat, difficulty breathing, a fast heartbeat, dizziness, and weakness. These would start a few minutes to a few hours after the vaccination. What should I do?  If you think it is a severe allergic reaction or other emergency that can't wait, call 9-1-1 and get the person to the nearest hospital. Otherwise, call your doctor.  Reactions should be reported to the Vaccine Adverse Event Reporting System (VAERS). Your doctor should file this report, or you  can do it yourself through the VAERS web site at www.vaers.SamedayNews.es, or by calling 6094730752. ? VAERS does not give medical advice. 6. The National Vaccine Injury Compensation Program The [employer] Vaccine Injury Compensation Program (VICP) is a federal program that was created to compensate people who may have been injured by certain vaccines. Persons who believe they may have been injured by a vaccine can learn about the program and about filing a claim by calling 458-267-6070 or visiting the [place] website at GoldCloset.com.ee. There is a time limit to file a claim for compensation. 7. How can I learn more?  Ask your healthcare provider. He or she can give you the vaccine package insert or suggest other sources of information.  Call your local or state health department.  Contact the Centers for Disease Control and Prevention (CDC): ? Call (540)164-9661 (1-800-CDC-INFO) or ? Visit CDC's website at https://gibson.com/ Vaccine Information Statement, Inactivated Influenza Vaccine (03/05/2014) This information is not intended to replace advice given to you by your health care provider. Make sure  you discuss any questions you have with your health care provider. Document Released: 05/10/2006 Document Revised: 04/05/2016 Document Reviewed: 04/05/2016 Elsevier Interactive Patient Education  2017 [employer]. Tdap Vaccine (Tetanus, Diphtheria and Pertussis): What You Need to Know 1. Why get vaccinated? Tetanus, diphtheria and pertussis are very serious diseases. Tdap vaccine can protect [place] from these diseases. And, Tdap vaccine given to pregnant women can protect newborn babies against pertussis. TETANUS (Lockjaw) is rare in the [place] today. It causes painful muscle tightening and stiffness, usually all over the body.  It can lead to tightening of muscles in the head and neck so you can't open your mouth, swallow, or sometimes even breathe. Tetanus kills about 1 out of 10  people who are infected even after receiving the best medical care.  DIPHTHERIA is also rare in the [place] today. It can cause a thick coating to form in the back of the throat.  It can lead to breathing problems, heart failure, paralysis, and death.  PERTUSSIS (Whooping Cough) causes severe coughing spells, which can cause difficulty breathing, vomiting and disturbed sleep.  It can also lead to weight loss, incontinence, and rib fractures. Up to 2 in 100 adolescents and 5 in 100 adults with pertussis are hospitalized or have complications, which could include pneumonia or death.  These diseases are caused by bacteria. Diphtheria and pertussis are spread from person to person through secretions from coughing or sneezing. Tetanus enters the body through cuts, scratches, or wounds. Before vaccines, as many as 200,000 cases of diphtheria, 200,000 cases of pertussis, and hundreds of cases of tetanus, were reported in the [place] each year. Since vaccination began, reports of cases for tetanus and diphtheria have dropped by about 99% and for pertussis by about 80%. 2. Tdap vaccine Tdap vaccine can protect adolescents and adults from tetanus, diphtheria, and pertussis. One dose of Tdap is routinely given at age 38 or 36. People who did not get Tdap at that age should get it as soon as possible. Tdap is especially important for healthcare professionals and anyone having close contact with a baby younger than 12 months. Pregnant women should get a dose of Tdap during every pregnancy, to protect the newborn from pertussis. Infants are most at risk for severe, life-threatening complications from pertussis. Another vaccine, called Td, protects against tetanus and diphtheria, but not pertussis. A Td booster should be given every 10 years. Tdap may be given as one of these boosters if you have never gotten Tdap before. Tdap may also be given after a severe cut or burn to prevent tetanus  infection. Your doctor or the person giving you the vaccine can give you more information. Tdap may safely be given at the same time as other vaccines. 3. Some people should not get this vaccine  A person who has ever had a life-threatening allergic reaction after a previous dose of any diphtheria, tetanus or pertussis containing vaccine, OR has a severe allergy to any part of this vaccine, should not get Tdap vaccine. Tell the person giving the vaccine about any severe allergies.  Anyone who had coma or long repeated seizures within 7 days after a childhood dose of DTP or DTaP, or a previous dose of Tdap, should not get Tdap, unless a cause other than the vaccine was found. They can still get Td.  Talk to your doctor if you: ? have seizures or another nervous system problem, ? had severe pain or swelling after any vaccine containing diphtheria,  tetanus or pertussis, ? ever had a condition called Guillain-Barr Syndrome (GBS), ? aren't feeling well on the day the shot is scheduled. 4. Risks With any medicine, including vaccines, there is a chance of side effects. These are usually mild and go away on their own. Serious reactions are also possible but are rare. Most people who get Tdap vaccine do not have any problems with it. Mild problems following Tdap: (Did not interfere with activities)  Pain where the shot was given (about 3 in 4 adolescents or 2 in 3 adults)  Redness or swelling where the shot was given (about 1 person in 5)  Mild fever of at least 100.67F (up to about 1 in 25 adolescents or 1 in 100 adults)  Headache (about 3 or 4 people in 10)  Tiredness (about 1 person in 3 or 4)  Nausea, vomiting, diarrhea, stomach ache (up to 1 in 4 adolescents or 1 in 10 adults)  Chills, sore joints (about 1 person in 10)  Body aches (about 1 person in 3 or 4)  Rash, swollen glands (uncommon)  Moderate problems following Tdap: (Interfered with activities, but did not require medical  attention)  Pain where the shot was given (up to 1 in 5 or 6)  Redness or swelling where the shot was given (up to about 1 in 16 adolescents or 1 in 12 adults)  Fever over 102F (about 1 in 100 adolescents or 1 in 250 adults)  Headache (about 1 in 7 adolescents or 1 in 10 adults)  Nausea, vomiting, diarrhea, stomach ache (up to 1 or 3 people in 100)  Swelling of the entire arm where the shot was given (up to about 1 in 500).  Severe problems following Tdap: (Unable to perform usual activities; required medical attention)  Swelling, severe pain, bleeding and redness in the arm where the shot was given (rare).  Problems that could happen after any vaccine:  People sometimes faint after a medical procedure, including vaccination. Sitting or lying down for about 15 minutes can help prevent fainting, and injuries caused by a fall. Tell your doctor if you feel dizzy, or have vision changes or ringing in the ears.  Some people get severe pain in the shoulder and have difficulty moving the arm where a shot was given. This happens very rarely.  Any medication can cause a severe allergic reaction. Such reactions from a vaccine are very rare, estimated at fewer than 1 in a million doses, and would happen within a few minutes to a few hours after the vaccination. As with any medicine, there is a very remote chance of a vaccine causing a serious injury or death. The safety of vaccines is always being monitored. For more information, visit: http://www.aguilar.org/ 5. What if there is a serious problem? What should I look for? Look for anything that concerns you, such as signs of a severe allergic reaction, very high fever, or unusual behavior. Signs of a severe allergic reaction can include hives, swelling of the face and throat, difficulty breathing, a fast heartbeat, dizziness, and weakness. These would usually start a few minutes to a few hours after the vaccination. What should I do?  If  you think it is a severe allergic reaction or other emergency that can't wait, call 9-1-1 or get the person to the nearest hospital. Otherwise, call your doctor.  Afterward, the reaction should be reported to the Vaccine Adverse Event Reporting System (VAERS). Your doctor might file this report, or you can do  it yourself through the VAERS web site at www.vaers.SamedayNews.es, or by calling 669-375-6209. ? VAERS does not give medical advice. 6. The National Vaccine Injury Compensation Program The [employer] Vaccine Injury Compensation Program (VICP) is a federal program that was created to compensate people who may have been injured by certain vaccines. Persons who believe they may have been injured by a vaccine can learn about the program and about filing a claim by calling [phone] or visiting the [place] website at GoldCloset.com.ee. There is a time limit to file a claim for compensation. 7. How can I learn more?  Ask your doctor. He or she can give you the vaccine package insert or suggest other sources of information.  Call your local or state health department.  Contact the Centers for Disease Control and Prevention (CDC): ? Call 315-161-6373 (1-800-CDC-INFO) or ? Visit CDC's website at http://hunter.com/ CDC Tdap Vaccine VIS (09/22/13) This information is not intended to replace advice given to you by your health care provider. Make sure you discuss any questions you have with your health care provider. Document Released: 01/15/2012 Document Revised: 04/05/2016 Document Reviewed: 04/05/2016 Elsevier Interactive Patient Education  2017 [employer].

## 2018-07-02 ENCOUNTER — Ambulatory Visit: Payer: BC Managed Care – PPO | Admitting: Family Medicine

## 2018-07-14 ENCOUNTER — Ambulatory Visit
Admission: RE | Admit: 2018-07-14 | Discharge: 2018-07-14 | Disposition: A | Discharge status: Home / Self Care | Payer: BC Managed Care – PPO | Source: Ambulatory Visit | Attending: Family Medicine | Admitting: Family Medicine

## 2018-07-14 DIAGNOSIS — R1030 Lower abdominal pain, unspecified: Secondary | ICD-10-CM | POA: Diagnosis not present

## 2018-07-25 ENCOUNTER — Encounter: Payer: Self-pay | Admitting: Obstetrics and Gynecology

## 2018-08-27 ENCOUNTER — Encounter: Payer: Self-pay | Admitting: Obstetrics and Gynecology

## 2018-09-09 ENCOUNTER — Other Ambulatory Visit: Payer: Self-pay | Admitting: Family Medicine

## 2018-09-09 NOTE — Telephone Encounter (Signed)
Requested Prescriptions  Pending Prescriptions Disp Refills  . escitalopram (LEXAPRO) 20 MG tablet [Pharmacy Med Name: ESCITALOPRAM OXALATE 20 MG TAB] 90 tablet 0    Sig: TAKE 1 TABLET BY MOUTH ONCE DAILY     Psychiatry:  Antidepressants - SSRI Passed - 09/09/2018  8:24 AM      Passed - Completed PHQ-2 or PHQ-9 in the last 360 days.      Passed - Valid encounter within last 6 months    Recent Outpatient Visits          2 months ago Hematuria, unspecified type   [facility] [place], [place]   3 months ago Acute lower UTI   [place], [place], [place]   6 months ago Routine general medical examination at a health care facility   [facility], [place], [place]   1 year ago Strain of muscle, fascia and tendon of lower back, initial encounter   [facility] Gu[place] [place], [place]   1 year ago Essential hypertension   [place], [place], [place]

## 2018-10-13 ENCOUNTER — Other Ambulatory Visit: Payer: Self-pay | Admitting: Family Medicine

## 2018-10-13 NOTE — Telephone Encounter (Signed)
Last refill on 09-09-18 and written for 90 tablets.  Patient should not be out of medication.  Disp Refills Start End   escitalopram (LEXAPRO) 20 MG tablet 90 tablet 0 09/09/2018    Sig: TAKE 1 TABLET BY MOUTH ONCE DAILY   Sent to pharmacy as: escitalopram (LEXAPRO) 20 MG tablet   Notes to Pharmacy: NEED REFILLS TODAY   E-Prescribing Status: Receipt confirmed by pharmacy (09/09/2018 8:30 AM EST)

## 2018-11-10 ENCOUNTER — Other Ambulatory Visit: Payer: Self-pay | Admitting: Family Medicine

## 2018-11-10 NOTE — Telephone Encounter (Signed)
Need office visit for further refills.

## 2019-01-09 ENCOUNTER — Other Ambulatory Visit: Payer: Self-pay | Admitting: Family Medicine

## 2019-01-09 NOTE — Telephone Encounter (Signed)
Please advise 

## 2019-01-09 NOTE — Telephone Encounter (Signed)
Needs follow-up

## 2019-01-12 NOTE — Telephone Encounter (Signed)
Pt scheduled for doximity call tomorrow.

## 2019-01-13 ENCOUNTER — Ambulatory Visit (INDEPENDENT_AMBULATORY_CARE_PROVIDER_SITE_OTHER): Payer: BC Managed Care – PPO | Admitting: Family Medicine

## 2019-01-13 ENCOUNTER — Other Ambulatory Visit: Payer: Self-pay

## 2019-01-13 ENCOUNTER — Encounter: Payer: Self-pay | Admitting: Family Medicine

## 2019-01-13 DIAGNOSIS — F419 Anxiety disorder, unspecified: Secondary | ICD-10-CM

## 2019-01-13 DIAGNOSIS — F330 Major depressive disorder, recurrent, mild: Secondary | ICD-10-CM

## 2019-01-13 MED ORDER — BUSPIRONE HCL 15 MG PO TABS: 15.0000 mg | ORAL_TABLET | Freq: Every day | ORAL | 3 refills | Status: DC

## 2019-01-13 MED ORDER — ESCITALOPRAM OXALATE 20 MG PO TABS: 20.0000 mg | ORAL_TABLET | Freq: Every day | ORAL | 1 refills | Status: DC

## 2019-01-13 NOTE — Assessment & Plan Note (Signed)
Under good control on current regimen. Continue current regimen. Continue to monitor. Call with any concerns. Refills given.   

## 2019-01-13 NOTE — Progress Notes (Signed)
Ht 5\' 2"  (1.575 m)   Wt 212 lb (96.2 kg)   BMI 38.78 kg/m    Subjective:    Patient ID: Paula Clay, female    DOB: 08-09-1970, 48 y.o.   MRN: [id number]  HPI: Paula Clay is a 48 y.o. female  Chief Complaint  Patient presents with  . Anxiety  . Depression   ANXIETY/DEPRESSION Duration:controlled Anxious mood: no  Excessive worrying: no Irritability: yes  Sweating: no Nausea: no Palpitations:no Hyperventilation: no Panic attacks: no Agoraphobia: no  Obscessions/compulsions: no Depressed mood: no Depression screen [facility] 2/9 01/13/2019 02/19/2018 02/18/2017 08/29/2016  Decreased Interest 0 0 0 0  Down, Depressed, Hopeless 0 0 1 0  PHQ - 2 Score 0 0 1 0  Altered sleeping 0 0 0 0  Tired, decreased energy 0 0 1 1  Change in appetite 1 0 0 1  Feeling bad or failure about yourself  1 0 0 1  Trouble concentrating 0 0 0 0  Moving slowly or fidgety/restless 0 0 0 0  Suicidal thoughts 0 0 0 0  PHQ-9 Score 2 0 2 3  Difficult doing work/chores - Not difficult at all - -   GAD 7 : Generalized Anxiety Score 01/13/2019 02/19/2018 02/18/2017 08/29/2016  Nervous, Anxious, on Edge 0 0 1 1  Control/stop worrying 0 0 0 0  Worry too much - different things 1 0 0 0  Trouble relaxing 0 0 - 0  Restless 0 0 0 0  Easily annoyed or irritable 1 0 1 1  Afraid - awful might happen 0 0 1 0  Total GAD 7 Score 2 0 - 2  Anxiety Difficulty - Not difficult at all Not difficult at all Not difficult at all   Anhedonia: no Weight changes: yes Insomnia: yes hard to fall asleep  Hypersomnia: no Fatigue/loss of energy: yes Feelings of worthlessness: no Feelings of guilt: no Impaired concentration/indecisiveness: no Suicidal ideations: no  Crying spells: no Recent Stressors/Life Changes: yes   Relationship problems: no   Family stress: yes     Financial stress: no    Job stress: no    Recent death/loss: no  Relevant past medical, surgical, family and social history reviewed and  updated as indicated. Interim medical history since our last visit reviewed. Allergies and medications reviewed and updated.  Review of Systems  Constitutional: Negative.   Respiratory: Negative.   Cardiovascular: Negative.   Gastrointestinal: Positive for constipation. Negative for abdominal distention, abdominal pain, anal bleeding, blood in stool, diarrhea, nausea, rectal pain and vomiting.  Musculoskeletal: Negative.   Skin: Negative.   Neurological: Negative.   Psychiatric/Behavioral: Negative.     Per HPI unless specifically indicated above     Objective:    Ht 5\' 2"  (1.575 m)   Wt 212 lb (96.2 kg)   BMI 38.78 kg/m   Wt Readings from Last 3 Encounters:  01/13/19 212 lb (96.2 kg)  06/25/18 201 lb 6.4 oz (91.4 kg)  05/27/18 201 lb (91.2 kg)    Physical Exam Vitals signs and nursing note reviewed.  Constitutional:      General: She is not in acute distress.    Appearance: Normal appearance. She is not ill-appearing, toxic-appearing or diaphoretic.  HENT:     Head: Normocephalic and atraumatic.     Right Ear: External ear normal.     Left Ear: External ear normal.     Nose: Nose normal.     Mouth/Throat:     Mouth:  Mucous membranes are moist.     Pharynx: Oropharynx is clear.  Eyes:     General: No scleral icterus.       Right eye: No discharge.        Left eye: No discharge.     Conjunctiva/sclera: Conjunctivae normal.     Pupils: Pupils are equal, round, and reactive to light.  Neck:     Musculoskeletal: Normal range of motion.  Pulmonary:     Effort: Pulmonary effort is normal. No respiratory distress.     Comments: Speaking in full sentences Musculoskeletal: Normal range of motion.  Skin:    Coloration: Skin is not jaundiced or pale.     Findings: No bruising, erythema, lesion or rash.  Neurological:     Mental Status: She is alert and oriented to person, place, and time. Mental status is at baseline.  Psychiatric:        Mood and Affect: Mood normal.         Behavior: Behavior normal.        Thought Content: Thought content normal.        Judgment: Judgment normal.     Results for orders placed or performed in visit on 06/25/18  Microscopic Examination   URINE  Result Value Ref Range   WBC, UA 0-5 0 - 5 /hpf   RBC, UA 11-30 (A) 0 - 2 /hpf   Epithelial Cells (non renal) 0-10 0 - 10 /hpf   Bacteria, UA None seen None seen/Few  UA/M w/rflx Culture, Routine   Specimen: Urine   URINE  Result Value Ref Range   Specific Gravity, UA 1.020 1.005 - 1.030   pH, UA 6.5 5.0 - 7.5   Color, UA Orange Yellow   Appearance Ur Cloudy (A) Clear   Leukocytes, UA Negative Negative   Protein, UA Trace (A) Negative/Trace   Glucose, UA Negative Negative   Ketones, UA 1+ (A) Negative   RBC, UA 3+ (A) Negative   Bilirubin, UA Negative Negative   Urobilinogen, Ur 2.0 (H) 0.2 - 1.0 mg/dL   Nitrite, UA Negative Negative   Microscopic Examination See below:       Assessment & Plan:   Problem List Items Addressed This Visit      Other   Anxiety    Under good control on current regimen. Continue current regimen. Continue to monitor. Call with any concerns. Refills given.        Relevant Medications   escitalopram (LEXAPRO) 20 MG tablet   busPIRone (BUSPAR) 15 MG tablet   Depression    Under good control on current regimen. Continue current regimen. Continue to monitor. Call with any concerns. Refills given.        Relevant Medications   escitalopram (LEXAPRO) 20 MG tablet   busPIRone (BUSPAR) 15 MG tablet       Follow up plan: Return in about 6 months (around 07/15/2019) for Physical.    . This visit was completed via Doximity due to the restrictions of the COVID-19 pandemic. All issues as above were discussed and addressed. Physical exam was done as above through visual confirmation on Doximity. If it was felt that the patient should be evaluated in the office, they were directed there. The patient verbally consented to this visit.  . Location of the patient: home . Location of the provider: work . Those involved with this call:  . Provider: Park Liter, DO . CMA: Lesle Chris, [place] . Front Desk/Registration: Don Perking  . Time  spent on call: 15 minutes with patient face to face via video conference. More than 50% of this time was spent in counseling and coordination of care. 23 minutes total spent in review of patient's record and preparation of their chart.

## 2019-02-02 ENCOUNTER — Other Ambulatory Visit: Payer: Self-pay | Admitting: Family Medicine

## 2019-02-02 DIAGNOSIS — Z1231 Encounter for screening mammogram for malignant neoplasm of breast: Secondary | ICD-10-CM

## 2019-02-10 ENCOUNTER — Encounter: Payer: Self-pay | Admitting: Family Medicine

## 2019-02-19 ENCOUNTER — Ambulatory Visit: Payer: BC Managed Care – PPO

## 2019-02-24 ENCOUNTER — Encounter: Payer: Self-pay | Admitting: Family Medicine

## 2019-03-05 ENCOUNTER — Other Ambulatory Visit: Payer: Self-pay

## 2019-03-05 ENCOUNTER — Ambulatory Visit (INDEPENDENT_AMBULATORY_CARE_PROVIDER_SITE_OTHER): Payer: BC Managed Care – PPO | Admitting: Family Medicine

## 2019-03-05 ENCOUNTER — Encounter: Payer: Self-pay | Admitting: Family Medicine

## 2019-03-05 VITALS — BP 121/81 | HR 81 | Temp 98.4°F | Ht 62.0 in | Wt 214.0 lb

## 2019-03-05 DIAGNOSIS — M2540 Effusion, unspecified joint: Secondary | ICD-10-CM | POA: Diagnosis not present

## 2019-03-05 DIAGNOSIS — H6981 Other specified disorders of Eustachian tube, right ear: Secondary | ICD-10-CM

## 2019-03-05 MED ORDER — PREDNISONE 50 MG PO TABS: 50.0000 mg | ORAL_TABLET | Freq: Every day | ORAL | 0 refills | Status: DC

## 2019-03-05 NOTE — Progress Notes (Signed)
BP 121/81   Pulse 81   Temp 98.4 F (36.9 C) (Oral)   Ht 5\' 2"  (1.575 m)   Wt 214 lb (97.1 kg)   SpO2 95%   BMI 39.14 kg/m    Subjective:    Patient ID: Paula Clay, female    DOB: [birth year], 48 y.o.   MRN: [id number]  HPI: Paula Clay is a 48 y.o. female  Chief Complaint  Patient presents with  . Foot Pain    right. ongoing for several months  . Ear Pain    right. since last night   ????GOUT- has been having R foot pain for several months.  Duration:months Right 1st metatarsophalangeal pain: yes  R back of the ankle- yes Left 1st metatarsophalangeal pain: no Right knee pain: no Left knee pain: no Severity: severe  Quality: throbbing Swelling: yes Redness: yes Trauma: no Recent dietary change or indiscretion: yes Fevers: no Nausea/vomiting: no Status:  stable  EAR PAIN Duration: 1 day Involved ear(s): right Severity:  moderate  Quality:  aching Fever: no Otorrhea: no Upper respiratory infection symptoms: no Pruritus: yes Hearing loss: no Water immersion yes Using Q-tips: yes Recurrent otitis media: no Status: worse Treatments attempted: none  Relevant past medical, surgical, family and social history reviewed and updated as indicated. Interim medical history since our last visit reviewed. Allergies and medications reviewed and updated.  Review of Systems  Constitutional: Negative.   HENT: Positive for ear pain. Negative for congestion, dental problem, drooling, ear discharge, facial swelling, hearing loss, mouth sores, nosebleeds, postnasal drip, rhinorrhea, sinus pressure, sinus pain, sneezing, sore throat, tinnitus, trouble swallowing and voice change.   Respiratory: Negative.   Cardiovascular: Negative.   Gastrointestinal: Negative.   Musculoskeletal: Positive for arthralgias and joint swelling. Negative for back pain, gait problem, myalgias, neck pain and neck stiffness.  Skin: Negative.   Psychiatric/Behavioral: Negative.     Per HPI unless specifically indicated above     Objective:    BP 121/81   Pulse 81   Temp 98.4 F (36.9 C) (Oral)   Ht 5\' 2"  (1.575 m)   Wt 214 lb (97.1 kg)   SpO2 95%   BMI 39.14 kg/m   Wt Readings from Last 3 Encounters:  03/05/19 214 lb (97.1 kg)  01/13/19 212 lb (96.2 kg)  06/25/18 201 lb 6.4 oz (91.4 kg)    Physical Exam Vitals signs and nursing note reviewed.  Constitutional:      General: She is not in acute distress.    Appearance: Normal appearance. She is not ill-appearing, toxic-appearing or diaphoretic.  HENT:     Head: Normocephalic and atraumatic.     Right Ear: Tympanic membrane, ear canal and external ear normal. There is no impacted cerumen.     Left Ear: Tympanic membrane, ear canal and external ear normal. There is no impacted cerumen.     Nose: Nose normal. No congestion or rhinorrhea.     Mouth/Throat:     Mouth: Mucous membranes are moist.     Pharynx: Oropharynx is clear. No oropharyngeal exudate or posterior oropharyngeal erythema.  Eyes:     General: No scleral icterus.       Right eye: No discharge.        Left eye: No discharge.     Extraocular Movements: Extraocular movements intact.     Conjunctiva/sclera: Conjunctivae normal.     Pupils: Pupils are equal, round, and reactive to light.  Neck:     Musculoskeletal: Normal  range of motion and neck supple.  Cardiovascular:     Rate and Rhythm: Normal rate and regular rhythm.     Pulses: Normal pulses.     Heart sounds: Normal heart sounds. No murmur. No friction rub. No gallop.   Pulmonary:     Effort: Pulmonary effort is normal. No respiratory distress.     Breath sounds: Normal breath sounds. No stridor. No wheezing, rhonchi or rales.  Chest:     Chest wall: No tenderness.  Musculoskeletal: Normal range of motion.        General: Swelling (back of R heel, mildly tender, not red) present.  Skin:    General: Skin is warm and dry.     Capillary Refill: Capillary refill takes less than 2  seconds.     Coloration: Skin is not jaundiced or pale.     Findings: No bruising, erythema, lesion or rash.  Neurological:     General: No focal deficit present.     Mental Status: She is alert and oriented to person, place, and time. Mental status is at baseline.  Psychiatric:        Mood and Affect: Mood normal.        Behavior: Behavior normal.        Thought Content: Thought content normal.        Judgment: Judgment normal.     Results for orders placed or performed in visit on 06/25/18  Microscopic Examination   URINE  Result Value Ref Range   WBC, UA 0-5 0 - 5 /hpf   RBC, UA 11-30 (A) 0 - 2 /hpf   Epithelial Cells (non renal) 0-10 0 - 10 /hpf   Bacteria, UA None seen None seen/Few  UA/M w/rflx Culture, Routine   Specimen: Urine   URINE  Result Value Ref Range   Specific Gravity, UA 1.020 1.005 - 1.030   pH, UA 6.5 5.0 - 7.5   Color, UA Orange Yellow   Appearance Ur Cloudy (A) Clear   Leukocytes, UA Negative Negative   Protein, UA Trace (A) Negative/Trace   Glucose, UA Negative Negative   Ketones, UA 1+ (A) Negative   RBC, UA 3+ (A) Negative   Bilirubin, UA Negative Negative   Urobilinogen, Ur 2.0 (H) 0.2 - 1.0 mg/dL   Nitrite, UA Negative Negative   Microscopic Examination See below:       Assessment & Plan:   Problem List Items Addressed This Visit    None    Visit Diagnoses    Swelling of joint    -  Primary   Concern for gout or arthritis- will check uric acid. Await results. Treat as needed. X-ray if uric acid normal.   Relevant Orders   Comprehensive metabolic panel   Uric acid   Dysfunction of right eustachian tube       Will treat with prednisone burst. Call if not getting better or getting worse.        Follow up plan: Return if symptoms worsen or fail to improve.

## 2019-03-06 ENCOUNTER — Ambulatory Visit
Admission: RE | Admit: 2019-03-06 | Discharge: 2019-03-06 | Disposition: A | Discharge status: Home / Self Care | Payer: BC Managed Care – PPO | Attending: Family Medicine | Admitting: Family Medicine

## 2019-03-06 ENCOUNTER — Other Ambulatory Visit: Payer: Self-pay | Admitting: Family Medicine

## 2019-03-06 ENCOUNTER — Ambulatory Visit
Admission: RE | Admit: 2019-03-06 | Discharge: 2019-03-06 | Disposition: A | Discharge status: Home / Self Care | Payer: BC Managed Care – PPO | Source: Ambulatory Visit | Attending: Family Medicine | Admitting: Family Medicine

## 2019-03-06 ENCOUNTER — Encounter: Payer: Self-pay | Admitting: Family Medicine

## 2019-03-06 DIAGNOSIS — M2540 Effusion, unspecified joint: Secondary | ICD-10-CM

## 2019-03-06 LAB — COMPREHENSIVE METABOLIC PANEL
ALT: 35 IU/L — ABNORMAL HIGH (ref 0–32)
AST: 21 IU/L (ref 0–40)
Albumin/Globulin Ratio: 1.9 (ref 1.2–2.2)
Albumin: 4.8 g/dL (ref 3.8–4.8)
Alkaline Phosphatase: 91 IU/L (ref 39–117)
BUN/Creatinine Ratio: 20 (ref 9–23)
BUN: 14 mg/dL (ref 6–24)
Bilirubin Total: <0.2 mg/dL (ref 0.0–1.2)
CO2: 24 mmol/L (ref 20–29)
Calcium: 9.1 mg/dL (ref 8.7–10.2)
Chloride: 104 mmol/L (ref 96–106)
Creatinine, Ser: 0.7 mg/dL (ref 0.57–1.00)
GFR calc Af Amer: 118 mL/min/{1.73_m2} (ref >59)
GFR calc non Af Amer: 102 mL/min/{1.73_m2} (ref >59)
Globulin, Total: 2.5 g/dL (ref 1.5–4.5)
Glucose: 83 mg/dL (ref 65–99)
Potassium: 4.2 mmol/L (ref 3.5–5.2)
Sodium: 144 mmol/L (ref 134–144)
Total Protein: 7.3 g/dL (ref 6.0–8.5)

## 2019-03-06 LAB — URIC ACID: Uric Acid: 5.6 mg/dL (ref 2.5–7.1)

## 2019-03-08 ENCOUNTER — Other Ambulatory Visit: Payer: Self-pay | Admitting: Family Medicine

## 2019-03-08 ENCOUNTER — Encounter: Payer: Self-pay | Admitting: Family Medicine

## 2019-03-08 DIAGNOSIS — M7731 Calcaneal spur, right foot: Secondary | ICD-10-CM

## 2019-03-09 NOTE — Addendum Note (Signed)
Addended by: Marnee Guarneri T on: 03/09/2019 01:51 PM   Modules accepted: Orders

## 2019-03-10 ENCOUNTER — Other Ambulatory Visit: Payer: Self-pay

## 2019-03-10 ENCOUNTER — Ambulatory Visit
Admission: RE | Admit: 2019-03-10 | Discharge: 2019-03-10 | Disposition: A | Discharge status: Home / Self Care | Payer: BC Managed Care – PPO | Source: Ambulatory Visit | Attending: Family Medicine | Admitting: Family Medicine

## 2019-03-10 DIAGNOSIS — Z1231 Encounter for screening mammogram for malignant neoplasm of breast: Secondary | ICD-10-CM | POA: Diagnosis present

## 2019-03-26 ENCOUNTER — Ambulatory Visit (INDEPENDENT_AMBULATORY_CARE_PROVIDER_SITE_OTHER): Payer: BC Managed Care – PPO | Admitting: Family Medicine

## 2019-03-26 ENCOUNTER — Other Ambulatory Visit: Payer: Self-pay

## 2019-03-26 ENCOUNTER — Encounter: Payer: Self-pay | Admitting: Family Medicine

## 2019-03-26 VITALS — BP 119/83 | HR 68 | Temp 98.8°F | Wt 208.0 lb

## 2019-03-26 DIAGNOSIS — Z23 Encounter for immunization: Secondary | ICD-10-CM | POA: Diagnosis not present

## 2019-03-26 DIAGNOSIS — Z0000 Encounter for general adult medical examination without abnormal findings: Secondary | ICD-10-CM

## 2019-03-26 LAB — UA/M W/RFLX CULTURE, ROUTINE
Bilirubin, UA: NEGATIVE
Glucose, UA: NEGATIVE
Ketones, UA: NEGATIVE
Leukocytes,UA: NEGATIVE
Nitrite, UA: NEGATIVE
Protein,UA: NEGATIVE
RBC, UA: NEGATIVE
Specific Gravity, UA: <1.005 — ABNORMAL LOW (ref 1.005–1.030)
Urobilinogen, Ur: 0.2 mg/dL (ref 0.2–1.0)
pH, UA: 5 (ref 5.0–7.5)

## 2019-03-26 LAB — BAYER DCA HB A1C WAIVED: HB A1C (BAYER DCA - WAIVED): 5.6 % (ref <7.0)

## 2019-03-26 NOTE — Progress Notes (Signed)
BP 119/83   Pulse 68   Temp 98.8 F (37.1 C)   Wt 208 lb (94.3 kg)   SpO2 96%   BMI 38.04 kg/m    Subjective:    Patient ID: Paula Clay, female    DOB: 1970-12-18, 48 y.o.   MRN: [id number]  HPI: Paula Clay is a 48 y.o. female presenting on 03/26/2019 for comprehensive medical examination. Current medical complaints include:none  Menopausal Symptoms: no  Depression Screen done today and results listed below:  Depression screen [facility] 2/9 01/13/2019 02/19/2018 02/18/2017 08/29/2016  Decreased Interest 0 0 0 0  Down, Depressed, Hopeless 0 0 1 0  PHQ - 2 Score 0 0 1 0  Altered sleeping 0 0 0 0  Tired, decreased energy 0 0 1 1  Change in appetite 1 0 0 1  Feeling bad or failure about yourself  1 0 0 1  Trouble concentrating 0 0 0 0  Moving slowly or fidgety/restless 0 0 0 0  Suicidal thoughts 0 0 0 0  PHQ-9 Score 2 0 2 3  Difficult doing work/chores - Not difficult at all - -   Past Medical History:  Past Medical History:  Diagnosis Date  . Actinic keratosis   . Anxiety   . Cancer ([place])    skin ca  . Depression   . GERD (gastroesophageal reflux disease)   . GERD (gastroesophageal reflux disease)   . Hyperlipidemia   . Hypertension   . Hypertension   . Pre-diabetes   . Thyroid goiter     Surgical History:  Past Surgical History:  Procedure Laterality Date  . ABDOMINAL HYSTERECTOMY  06/2016   total  . BREAST CYST ASPIRATION     ? side-neg  . NASAL SEPTUM SURGERY    . TONSILLECTOMY      Medications:  Current Outpatient Medications on File Prior to Visit  Medication Sig  . busPIRone (BUSPAR) 15 MG tablet Take 1 tablet (15 mg total) by mouth daily.  Marland Kitchen escitalopram (LEXAPRO) 20 MG tablet Take 1 tablet (20 mg total) by mouth daily.   No current facility-administered medications on file prior to visit.     Allergies:  Allergies  Allergen Reactions  . Lisinopril Cough    Social History:  Social History   Socioeconomic History  . Marital  status: Divorced    Spouse name: Not on file  . Number of children: Not on file  . Years of education: Not on file  . Highest education level: Not on file  Occupational History  . Not on file  Social Needs  . Financial resource strain: Not on file  . Food insecurity    Worry: Not on file    Inability: Not on file  . Transportation needs    Medical: Not on file    Non-medical: Not on file  Tobacco Use  . Smoking status: Never Smoker  . Smokeless tobacco: Never Used  Substance and Sexual Activity  . Alcohol use: No    Comment: Very Rarely  . Drug use: No  . Sexual activity: Never  Lifestyle  . Physical activity    Days per week: Not on file    Minutes per session: Not on file  . Stress: Not on file  Relationships  . Social Herbalist on phone: Not on file    Gets together: Not on file    Attends religious service: Not on file    Active member of club or  organization: Not on file    Attends meetings of clubs or organizations: Not on file    Relationship status: Not on file  . Intimate partner violence    Fear of current or ex partner: Not on file    Emotionally abused: Not on file    Physically abused: Not on file    Forced sexual activity: Not on file  Other Topics Concern  . Not on file  Social History Narrative  . Not on file   Social History   Tobacco Use  Smoking Status Never Smoker  Smokeless Tobacco Never Used   Social History   Substance and Sexual Activity  Alcohol Use No   Comment: Very Rarely    Family History:  Family History  Problem Relation Age of Onset  . Diabetes Mother   . Hypertension Mother   . Bipolar disorder Mother   . Diabetes Father   . Cancer Father        bladder  . Renal Cyst Father   . Pulmonary Hypertension Father   . Thyroid disease Sister   . Hypertension Maternal Grandmother   . Thyroid disease Maternal Grandmother   . Glaucoma Paternal Grandmother   . Osteoporosis Paternal Grandmother   . Breast cancer  Neg Hx     Past medical history, surgical history, medications, allergies, family history and social history reviewed with patient today and changes made to appropriate areas of the chart.   Review of Systems  Constitutional: Negative.   HENT: Negative.   Eyes: Negative.   Respiratory: Negative.   Cardiovascular: Negative.   Gastrointestinal: Positive for constipation. Negative for abdominal pain, blood in stool, diarrhea, heartburn, melena, nausea and vomiting.  Genitourinary: Negative.   Musculoskeletal: Negative.   Skin: Negative.   Neurological: Positive for dizziness. Negative for tingling, tremors, sensory change, speech change, focal weakness, seizures, loss of consciousness, weakness and headaches.  Endo/Heme/Allergies: Negative.   Psychiatric/Behavioral: Negative.     All other ROS negative except what is listed above and in the HPI.      Objective:    BP 119/83   Pulse 68   Temp 98.8 F (37.1 C)   Wt 208 lb (94.3 kg)   SpO2 96%   BMI 38.04 kg/m   Wt Readings from Last 3 Encounters:  03/26/19 208 lb (94.3 kg)  03/05/19 214 lb (97.1 kg)  01/13/19 212 lb (96.2 kg)    Physical Exam Vitals signs and nursing note reviewed.  Constitutional:      General: She is not in acute distress.    Appearance: Normal appearance. She is not ill-appearing, toxic-appearing or diaphoretic.  HENT:     Head: Normocephalic and atraumatic.     Right Ear: Tympanic membrane, ear canal and external ear normal. There is no impacted cerumen.     Left Ear: Tympanic membrane, ear canal and external ear normal. There is no impacted cerumen.     Nose: Nose normal. No congestion or rhinorrhea.     Mouth/Throat:     Mouth: Mucous membranes are moist.     Pharynx: Oropharynx is clear. No oropharyngeal exudate or posterior oropharyngeal erythema.  Eyes:     General: No scleral icterus.       Right eye: No discharge.        Left eye: No discharge.     Extraocular Movements: Extraocular  movements intact.     Conjunctiva/sclera: Conjunctivae normal.     Pupils: Pupils are equal, round, and reactive to light.  Neck:     Musculoskeletal: Normal range of motion and neck supple. No neck rigidity or muscular tenderness.     Vascular: No carotid bruit.  Cardiovascular:     Rate and Rhythm: Normal rate and regular rhythm.     Pulses: Normal pulses.     Heart sounds: No murmur. No friction rub. No gallop.   Pulmonary:     Effort: Pulmonary effort is normal. No respiratory distress.     Breath sounds: Normal breath sounds. No stridor. No wheezing, rhonchi or rales.  Chest:     Chest wall: No tenderness.  Abdominal:     General: Abdomen is flat. Bowel sounds are normal. There is no distension.     Palpations: Abdomen is soft. There is no mass.     Tenderness: There is no abdominal tenderness. There is no right CVA tenderness, left CVA tenderness, guarding or rebound.     Hernia: No hernia is present.  Genitourinary:    Comments: Breast and pelvic exams deferred with shared decision making Musculoskeletal:        General: No swelling, tenderness, deformity or signs of injury.     Right lower leg: No edema.     Left lower leg: No edema.  Lymphadenopathy:     Cervical: No cervical adenopathy.  Skin:    General: Skin is warm and dry.     Capillary Refill: Capillary refill takes less than 2 seconds.     Coloration: Skin is not jaundiced or pale.     Findings: No bruising, erythema, lesion or rash.  Neurological:     General: No focal deficit present.     Mental Status: She is alert and oriented to person, place, and time. Mental status is at baseline.     Cranial Nerves: No cranial nerve deficit.     Sensory: No sensory deficit.     Motor: No weakness.     Coordination: Coordination normal.     Gait: Gait normal.     Deep Tendon Reflexes: Reflexes normal.  Psychiatric:        Mood and Affect: Mood normal.        Behavior: Behavior normal.        Thought Content: Thought  content normal.        Judgment: Judgment normal.     Results for orders placed or performed in visit on 03/05/19  Comprehensive metabolic panel  Result Value Ref Range   Glucose 83 65 - 99 mg/dL   BUN 14 6 - 24 mg/dL   Creatinine, Ser 0.70 0.57 - 1.00 mg/dL   GFR calc non Af Amer 102 >59 mL/min/1.73   GFR calc Af Amer 118 >59 mL/min/1.73   BUN/Creatinine Ratio 20 9 - 23   Sodium 144 134 - 144 mmol/L   Potassium 4.2 3.5 - 5.2 mmol/L   Chloride 104 96 - 106 mmol/L   CO2 24 20 - 29 mmol/L   Calcium 9.1 8.7 - 10.2 mg/dL   Total Protein 7.3 6.0 - 8.5 g/dL   Albumin 4.8 3.8 - 4.8 g/dL   Globulin, Total 2.5 1.5 - 4.5 g/dL   Albumin/Globulin Ratio 1.9 1.2 - 2.2   Bilirubin Total <0.2 0.0 - 1.2 mg/dL   Alkaline Phosphatase 91 39 - 117 IU/L   AST 21 0 - 40 IU/L   ALT 35 (H) 0 - 32 IU/L  Uric acid  Result Value Ref Range   Uric Acid 5.6 2.5 - 7.1 mg/dL  Assessment & Plan:   Problem List Items Addressed This Visit    None    Visit Diagnoses    Routine general medical examination at a health care facility    -  Primary   Vaccines up to date. Screening labs checked today. Continue diet and exercise. Call with any concerns. Continue to monitor. Mammogram UTD. Pap N/A.   Relevant Orders   CBC with Differential/Platelet   Bayer DCA Hb A1c Waived   Comprehensive metabolic panel   Lipid Panel w/o Chol/HDL Ratio   TSH   UA/M w/rflx Culture, Routine   Needs flu shot       Flu shot givn today.   Relevant Orders   Flu Vaccine QUAD 6+ mos PF IM (Fluarix Quad PF)       Follow up plan: Return in about 6 months (around 09/26/2019).   LABORATORY TESTING:  - Pap smear: not applicable  IMMUNIZATIONS:   - Tdap: Tetanus vaccination status reviewed: last tetanus booster within 10 years. - Influenza: Administered today - Pneumovax: Not applicable  SCREENING: -Mammogram: Up to date   PATIENT COUNSELING:   Advised to take 1 mg of folate supplement per day if capable of  pregnancy.   Sexuality: Discussed sexually transmitted diseases, partner selection, use of condoms, avoidance of unintended pregnancy  and contraceptive alternatives.   Advised to avoid cigarette smoking.  I discussed with the patient that most people either abstain from alcohol or drink within safe limits (<=14/week and <=4 drinks/occasion for males, <=7/weeks and <= 3 drinks/occasion for females) and that the risk for alcohol disorders and other health effects rises proportionally with the number of drinks per week and how often a drinker exceeds daily limits.  Discussed cessation/primary prevention of drug use and availability of treatment for abuse.   Diet: Encouraged to adjust caloric intake to maintain  or achieve ideal body weight, to reduce intake of dietary saturated fat and total fat, to limit sodium intake by avoiding high sodium foods and not adding table salt, and to maintain adequate dietary potassium and calcium preferably from fresh fruits, vegetables, and low-fat dairy products.    stressed the importance of regular exercise  Injury prevention: Discussed safety belts, safety helmets, smoke detector, smoking near bedding or upholstery.   Dental health: Discussed importance of regular tooth brushing, flossing, and dental visits.    NEXT PREVENTATIVE PHYSICAL DUE IN 1 YEAR. Return in about 6 months (around 09/26/2019).

## 2019-03-26 NOTE — Patient Instructions (Addendum)
Health Maintenance, Female Adopting a healthy lifestyle and getting preventive care are important in promoting health and wellness. Ask your health care provider about:  The right schedule for you to have regular tests and exams.  Things you can do on your own to prevent diseases and keep yourself healthy. What should I know about diet, weight, and exercise? Eat a healthy diet   Eat a diet that includes plenty of vegetables, fruits, low-fat dairy products, and lean protein.  Do not eat a lot of foods that are high in solid fats, added sugars, or sodium. Maintain a healthy weight Body mass index (BMI) is used to identify weight problems. It estimates body fat based on height and weight. Your health care provider can help determine your BMI and help you achieve or maintain a healthy weight. Get regular exercise Get regular exercise. This is one of the most important things you can do for your health. Most adults should:  Exercise for at least 150 minutes each week. The exercise should increase your heart rate and make you sweat (moderate-intensity exercise).  Do strengthening exercises at least twice a week. This is in addition to the moderate-intensity exercise.  Spend less time sitting. Even light physical activity can be beneficial. Watch cholesterol and blood lipids Have your blood tested for lipids and cholesterol at 49 years of age, then have this test every 5 years. Have your cholesterol levels checked more often if:  Your lipid or cholesterol levels are high.  You are older than 49 years of age.  You are at high risk for heart disease. What should I know about cancer screening? Depending on your health history and family history, you may need to have cancer screening at various ages. This may include screening for:  Breast cancer.  Cervical cancer.  Colorectal cancer.  Skin cancer.  Lung cancer. What should I know about heart disease, diabetes, and high blood  pressure? Blood pressure and heart disease  High blood pressure causes heart disease and increases the risk of stroke. This is more likely to develop in people who have high blood pressure readings, are of African descent, or are overweight.  Have your blood pressure checked: ? Every 3-5 years if you are 18-39 years of age. ? Every year if you are 40 years old or older. Diabetes Have regular diabetes screenings. This checks your fasting blood sugar level. Have the screening done:  Once every three years after age 40 if you are at a normal weight and have a low risk for diabetes.  More often and at a younger age if you are overweight or have a high risk for diabetes. What should I know about preventing infection? Hepatitis B If you have a higher risk for hepatitis B, you should be screened for this virus. Talk with your health care provider to find out if you are at risk for hepatitis B infection. Hepatitis C Testing is recommended for:  Everyone born from 1945 through 1965.  Anyone with known risk factors for hepatitis C. Sexually transmitted infections (STIs)  Get screened for STIs, including gonorrhea and chlamydia, if: ? You are sexually active and are younger than 49 years of age. ? You are older than 49 years of age and your health care provider tells you that you are at risk for this type of infection. ? Your sexual activity has changed since you were last screened, and you are at increased risk for chlamydia or gonorrhea. Ask your health care provider if   you are at risk.  Ask your health care provider about whether you are at high risk for HIV. Your health care provider may recommend a prescription medicine to help prevent HIV infection. If you choose to take medicine to prevent HIV, you should first get tested for HIV. You should then be tested every 3 months for as long as you are taking the medicine. Pregnancy  If you are about to stop having your period (premenopausal) and  you may become pregnant, seek counseling before you get pregnant.  Take 400 to 800 micrograms (mcg) of folic acid every day if you become pregnant.  Ask for birth control (contraception) if you want to prevent pregnancy. Osteoporosis and menopause Osteoporosis is a disease in which the bones lose minerals and strength with aging. This can result in bone fractures. If you are 55 years old or older, or if you are at risk for osteoporosis and fractures, ask your health care provider if you should:  Be screened for bone loss.  Take a calcium or vitamin D supplement to lower your risk of fractures.  Be given hormone replacement therapy (HRT) to treat symptoms of menopause. Follow these instructions at home: Lifestyle  Do not use any products that contain nicotine or tobacco, such as cigarettes, e-cigarettes, and chewing tobacco. If you need help quitting, ask your health care provider.  Do not use street drugs.  Do not share needles.  Ask your health care provider for help if you need support or information about quitting drugs. Alcohol use  Do not drink alcohol if: ? Your health care provider tells you not to drink. ? You are pregnant, may be pregnant, or are planning to become pregnant.  If you drink alcohol: ? Limit how much you use to 0-1 drink a day. ? Limit intake if you are breastfeeding.  Be aware of how much alcohol is in your drink. In the [place] one drink equals one 12 oz bottle of beer (355 mL), one 5 oz glass of wine (148 mL), or one 1 oz glass of hard liquor (44 mL). General instructions  Schedule regular health, dental, and eye exams.  Stay current with your vaccines.  Tell your health care provider if: ? You often feel depressed. ? You have ever been abused or do not feel safe at home. Summary  Adopting a healthy lifestyle and getting preventive care are important in promoting health and wellness.  Follow your health care provider's instructions about healthy  diet, exercising, and getting tested or screened for diseases.  Follow your health care provider's instructions on monitoring your cholesterol and blood pressure. This information is not intended to replace advice given to you by your health care provider. Make sure you discuss any questions you have with your health care provider. Document Released: 01/29/2011 Document Revised: 07/09/2018 Document Reviewed: 07/09/2018 Elsevier Patient Education  2020 [employer]. How to Perform the Epley Maneuver The Epley maneuver is an exercise that relieves symptoms of vertigo. Vertigo is the feeling that you or your surroundings are moving when they are not. When you feel vertigo, you may feel like the room is spinning and have trouble walking. Dizziness is a little different than vertigo. When you are dizzy, you may feel unsteady or light-headed. You can do this maneuver at home whenever you have symptoms of vertigo. You can do it up to 3 times a day until your symptoms go away. Even though the Epley maneuver may relieve your vertigo for a few weeks,  it is possible that your symptoms will return. This maneuver relieves vertigo, but it does not relieve dizziness. What are the risks? If it is done correctly, the Epley maneuver is considered safe. Sometimes it can lead to dizziness or nausea that goes away after a short time. If you develop other symptoms, such as changes in vision, weakness, or numbness, stop doing the maneuver and call your health care provider. How to perform the Epley maneuver 1. Sit on the edge of a bed or table with your back straight and your legs extended or hanging over the edge of the bed or table. 2. Turn your head halfway toward the affected ear or side. 3. Lie backward quickly with your head turned until you are lying flat on your back. You may want to position a pillow under your shoulders. 4. Hold this position for 30 seconds. You may experience an attack of vertigo. This is  normal. 5. Turn your head to the opposite direction until your unaffected ear is facing the floor. 6. Hold this position for 30 seconds. You may experience an attack of vertigo. This is normal. Hold this position until the vertigo stops. 7. Turn your whole body to the same side as your head. Hold for another 30 seconds. 8. Sit back up. You can repeat this exercise up to 3 times a day. Follow these instructions at home:  After doing the Epley maneuver, you can return to your normal activities.  Ask your health care provider if there is anything you should do at home to prevent vertigo. He or she may recommend that you: ? Keep your head raised (elevated) with two or more pillows while you sleep. ? Do not sleep on the side of your affected ear. ? Get up slowly from bed. ? Avoid sudden movements during the day. ? Avoid extreme head movement, like looking up or bending over. Contact a health care provider if:  Your vertigo gets worse.  You have other symptoms, including: ? Nausea. ? Vomiting. ? Headache. Get help right away if:  You have vision changes.  You have a severe or worsening headache or neck pain.  You cannot stop vomiting.  You have new numbness or weakness in any part of your body. Summary  Vertigo is the feeling that you or your surroundings are moving when they are not.  The Epley maneuver is an exercise that relieves symptoms of vertigo.  If the Epley maneuver is done correctly, it is considered safe. You can do it up to 3 times a day. This information is not intended to replace advice given to you by your health care provider. Make sure you discuss any questions you have with your health care provider. Document Released: 07/21/2013 Document Revised: 06/28/2017 Document Reviewed: 06/05/2016 Elsevier Patient Education  [place]. Influenza (Flu) Vaccine (Inactivated or Recombinant): What You Need to Know 1. Why get vaccinated? Influenza vaccine can  prevent influenza (flu). Flu is a contagious disease that spreads around the [place] every year, usually between October and May. Anyone can get the flu, but it is more dangerous for some people. Infants and young children, people 67 years of age and older, pregnant women, and people with certain health conditions or a weakened immune system are at greatest risk of flu complications. Pneumonia, bronchitis, sinus infections and ear infections are examples of flu-related complications. If you have a medical condition, such as heart disease, cancer or diabetes, flu can make it worse. Flu can cause fever  and chills, sore throat, muscle aches, fatigue, cough, headache, and runny or stuffy nose. Some people may have vomiting and diarrhea, though this is more common in children than adults. Each year thousands of people in the [place] die from flu, and many more are hospitalized. Flu vaccine prevents millions of illnesses and flu-related visits to the doctor each year. 2. Influenza vaccine CDC recommends everyone 58 months of age and older get vaccinated every flu season. Children 6 months through 53 years of age may need 2 doses during a single flu season. Everyone else needs only 1 dose each flu season. It takes about 2 weeks for protection to develop after vaccination. There are many flu viruses, and they are always changing. Each year a new flu vaccine is made to protect against three or four viruses that are likely to cause disease in the upcoming flu season. Even when the vaccine doesn't exactly match these viruses, it may still provide some protection. Influenza vaccine does not cause flu. Influenza vaccine may be given at the same time as other vaccines. 3. Talk with your health care provider Tell your vaccine provider if the person getting the vaccine:  Has had an allergic reaction after a previous dose of influenza vaccine, or has any severe, life-threatening allergies.  Has ever had  Guillain-Barr Syndrome (also called GBS). In some cases, your health care provider may decide to postpone influenza vaccination to a future visit. People with minor illnesses, such as a cold, may be vaccinated. People who are moderately or severely ill should usually wait until they recover before getting influenza vaccine. Your health care provider can give you more information. 4. Risks of a vaccine reaction  Soreness, redness, and swelling where shot is given, fever, muscle aches, and headache can happen after influenza vaccine.  There may be a very small increased risk of Guillain-Barr Syndrome (GBS) after inactivated influenza vaccine (the flu shot). Young children who get the flu shot along with pneumococcal vaccine (PCV13), and/or DTaP vaccine at the same time might be slightly more likely to have a seizure caused by fever. Tell your health care provider if a child who is getting flu vaccine has ever had a seizure. People sometimes faint after medical procedures, including vaccination. Tell your provider if you feel dizzy or have vision changes or ringing in the ears. As with any medicine, there is a very remote chance of a vaccine causing a severe allergic reaction, other serious injury, or death. 5. What if there is a serious problem? An allergic reaction could occur after the vaccinated person leaves the clinic. If you see signs of a severe allergic reaction (hives, swelling of the face and throat, difficulty breathing, a fast heartbeat, dizziness, or weakness), call 9-1-1 and get the person to the nearest hospital. For other signs that concern you, call your health care provider. Adverse reactions should be reported to the Vaccine Adverse Event Reporting System (VAERS). Your health care provider will usually file this report, or you can do it yourself. Visit the VAERS website at www.vaers.SamedayNews.es or call 346-615-5744.VAERS is only for reporting reactions, and VAERS staff do not give  medical advice. 6. The National Vaccine Injury Compensation Program The [employer] Vaccine Injury Compensation Program (VICP) is a federal program that was created to compensate people who may have been injured by certain vaccines. Visit the VICP website at GoldCloset.com.ee or call 8327948865 to learn about the program and about filing a claim. There is a time limit to file a  claim for compensation. 7. How can I learn more?  Ask your healthcare provider.  Call your local or state health department.  Contact the Centers for Disease Control and Prevention (CDC): ? Call 3390588274 (1-800-CDC-INFO) or ? Visit CDC's https://gibson.com/ Vaccine Information Statement (Interim) Inactivated Influenza Vaccine (03/13/2018) This information is not intended to replace advice given to you by your health care provider. Make sure you discuss any questions you have with your health care provider. Document Released: 05/10/2006 Document Revised: 11/04/2018 Document Reviewed: 03/17/2018 Elsevier Patient Education  2020 [employer].

## 2019-03-27 LAB — COMPREHENSIVE METABOLIC PANEL
ALT: 29 IU/L (ref 0–32)
AST: 18 IU/L (ref 0–40)
Albumin/Globulin Ratio: 1.7 (ref 1.2–2.2)
Albumin: 4.7 g/dL (ref 3.8–4.8)
Alkaline Phosphatase: 95 IU/L (ref 39–117)
BUN/Creatinine Ratio: 25 — ABNORMAL HIGH (ref 9–23)
BUN: 18 mg/dL (ref 6–24)
Bilirubin Total: 0.2 mg/dL (ref 0.0–1.2)
CO2: 25 mmol/L (ref 20–29)
Calcium: 9.5 mg/dL (ref 8.7–10.2)
Chloride: 96 mmol/L (ref 96–106)
Creatinine, Ser: 0.72 mg/dL (ref 0.57–1.00)
GFR calc Af Amer: 114 mL/min/{1.73_m2} (ref >59)
GFR calc non Af Amer: 99 mL/min/{1.73_m2} (ref >59)
Globulin, Total: 2.7 g/dL (ref 1.5–4.5)
Glucose: 94 mg/dL (ref 65–99)
Potassium: 4.5 mmol/L (ref 3.5–5.2)
Sodium: 137 mmol/L (ref 134–144)
Total Protein: 7.4 g/dL (ref 6.0–8.5)

## 2019-03-27 LAB — LIPID PANEL W/O CHOL/HDL RATIO
Cholesterol, Total: 213 mg/dL — ABNORMAL HIGH (ref 100–199)
HDL: 55 mg/dL (ref >39)
LDL Calculated: 137 mg/dL — ABNORMAL HIGH (ref 0–99)
Triglycerides: 105 mg/dL (ref 0–149)
VLDL Cholesterol Cal: 21 mg/dL (ref 5–40)

## 2019-03-27 LAB — CBC WITH DIFFERENTIAL/PLATELET
Basophils Absolute: 0.1 10*3/uL (ref 0.0–0.2)
Basos: 1 %
EOS (ABSOLUTE): 0.1 10*3/uL (ref 0.0–0.4)
Eos: 2 %
Hematocrit: 45.2 % (ref 34.0–46.6)
Hemoglobin: 15.1 g/dL (ref 11.1–15.9)
Immature Grans (Abs): 0 10*3/uL (ref 0.0–0.1)
Immature Granulocytes: 1 %
Lymphocytes Absolute: 3.2 10*3/uL — ABNORMAL HIGH (ref 0.7–3.1)
Lymphs: 37 %
MCH: 29.8 pg (ref 26.6–33.0)
MCHC: 33.4 g/dL (ref 31.5–35.7)
MCV: 89 fL (ref 79–97)
Monocytes Absolute: 0.6 10*3/uL (ref 0.1–0.9)
Monocytes: 7 %
Neutrophils Absolute: 4.7 10*3/uL (ref 1.4–7.0)
Neutrophils: 52 %
Platelets: 219 10*3/uL (ref 150–450)
RBC: 5.07 x10E6/uL (ref 3.77–5.28)
RDW: 13.9 % (ref 11.7–15.4)
WBC: 8.7 10*3/uL (ref 3.4–10.8)

## 2019-03-27 LAB — TSH: TSH: 0.896 u[IU]/mL (ref 0.450–4.500)

## 2019-04-08 ENCOUNTER — Encounter: Payer: Self-pay | Admitting: Family Medicine

## 2019-08-10 ENCOUNTER — Encounter: Payer: Self-pay | Admitting: Family Medicine

## 2019-10-01 ENCOUNTER — Ambulatory Visit: Payer: BC Managed Care – PPO | Admitting: Family Medicine

## 2019-10-22 ENCOUNTER — Other Ambulatory Visit: Payer: Self-pay | Admitting: Family Medicine

## 2019-10-22 NOTE — Telephone Encounter (Signed)
Requested Prescriptions  Pending Prescriptions Disp Refills  . escitalopram (LEXAPRO) 20 MG tablet [Pharmacy Med Name: ESCITALOPRAM OXALATE 20 MG TAB] 30 tablet 0    Sig: TAKE 1 TABLET BY MOUTH ONCE DAILY     Psychiatry:  Antidepressants - SSRI Failed - 10/22/2019  3:19 PM      Failed - Valid encounter within last 6 months    Recent Outpatient Visits          7 months ago Routine general medical examination at a health care facility   [facility], [place] P, DO   7 months ago Swelling of joint   [place], [name], DO   9 months ago Mild episode of recurrent major depressive disorder [facility])   [place], [place] P, DO   1 year ago Hematuria, unspecified type   [facility] [facility], [place]   1 year ago Acute lower UTI   [facility], Lilia Argue, [place]      Future Appointments            In 4 days Wynetta Emery, Barb Merino, DO [place], [place] - Completed PHQ-2 or PHQ-9 in the last 360 days.

## 2019-10-26 ENCOUNTER — Other Ambulatory Visit: Payer: Self-pay

## 2019-10-26 ENCOUNTER — Ambulatory Visit: Payer: BC Managed Care – PPO | Admitting: Family Medicine

## 2019-10-26 ENCOUNTER — Encounter: Payer: Self-pay | Admitting: Family Medicine

## 2019-10-26 VITALS — BP 125/82 | HR 67 | Wt 223.1 lb

## 2019-10-26 DIAGNOSIS — F419 Anxiety disorder, unspecified: Secondary | ICD-10-CM

## 2019-10-26 DIAGNOSIS — H6981 Other specified disorders of Eustachian tube, right ear: Secondary | ICD-10-CM | POA: Diagnosis not present

## 2019-10-26 DIAGNOSIS — F330 Major depressive disorder, recurrent, mild: Secondary | ICD-10-CM

## 2019-10-26 DIAGNOSIS — S96911A Strain of unspecified muscle and tendon at ankle and foot level, right foot, initial encounter: Secondary | ICD-10-CM

## 2019-10-26 MED ORDER — BUSPIRONE HCL 15 MG PO TABS: 15.0000 mg | ORAL_TABLET | Freq: Every day | ORAL | 6 refills | Status: DC

## 2019-10-26 MED ORDER — BUPROPION HCL ER (SR) 150 MG PO TB12: ORAL_TABLET | ORAL | 3 refills | Status: DC

## 2019-10-26 MED ORDER — ESCITALOPRAM OXALATE 20 MG PO TABS: 20.0000 mg | ORAL_TABLET | Freq: Every day | ORAL | 1 refills | Status: DC

## 2019-10-26 NOTE — Progress Notes (Signed)
BP 125/82   Pulse 67   Wt 223 lb 2 oz (101.2 kg)   SpO2 97%   BMI 40.81 kg/m    Subjective:    Patient ID: Paula Clay, female    DOB: [birth year], 49 y.o.   MRN: [id number]  HPI: Paula Clay is a 49 y.o. female  Chief Complaint  Patient presents with  . Depression  . Anxiety  . Foot Pain    Right, chronic pain in foot, this is a new pain below ankle on right foot started on Saturday  . Ear Fullness    Patient states that it sounds like a crunching in her ears   ANXIETY/DEPRESSION- having less interest, has quit a lot of things. Not feeling like herself at all since about the beginning of the year Duration:exacerbated Anxious mood: yes  Excessive worrying: yes Irritability: yes  Sweating: no Nausea: no Palpitations:no Hyperventilation: no Panic attacks: no Agoraphobia: no  Obscessions/compulsions: no Depressed mood: yes Depression screen [facility] 2/9 10/26/2019 01/13/2019 02/19/2018 02/18/2017 08/29/2016  Decreased Interest 3 0 0 0 0  Down, Depressed, Hopeless 3 0 0 1 0  PHQ - 2 Score 6 0 0 1 0  Altered sleeping 3 0 0 0 0  Tired, decreased energy 3 0 0 1 1  Change in appetite 3 1 0 0 1  Feeling bad or failure about yourself  2 1 0 0 1  Trouble concentrating 3 0 0 0 0  Moving slowly or fidgety/restless 0 0 0 0 0  Suicidal thoughts 0 0 0 0 0  PHQ-9 Score 20 2 0 2 3  Difficult doing work/chores Somewhat difficult - Not difficult at all - -   GAD 7 : Generalized Anxiety Score 10/26/2019 01/13/2019 02/19/2018 02/18/2017  Nervous, Anxious, on Edge 3 0 0 1  Control/stop worrying 3 0 0 0  Worry too much - different things 3 1 0 0  Trouble relaxing 3 0 0 -  Restless 0 0 0 0  Easily annoyed or irritable 2 1 0 1  Afraid - awful might happen 0 0 0 1  Total GAD 7 Score 14 2 0 -  Anxiety Difficulty Somewhat difficult - Not difficult at all Not difficult at all   Anhedonia: no Weight changes: no Insomnia: yes hard to fall asleep  Hypersomnia: no Fatigue/loss of  energy: yes Feelings of worthlessness: yes Feelings of guilt: yes Impaired concentration/indecisiveness: yes Suicidal ideations: no  Crying spells: no Recent Stressors/Life Changes: yes  R foot has been hurting. Has been hurting when she goes to step.   EAG CLOGGED Duration: weeks Involved ear(s):  "right Sensation of feeling clogged/plugged: yes Decreased/muffled hearing:yes Ear pain: no Fever: no Otorrhea: no Hearing loss: no Upper respiratory infection symptoms: no Using Q-Tips: no Status: worse History of cerumenosis: no Treatments attempted: none   Relevant past medical, surgical, family and social history reviewed and updated as indicated. Interim medical history since our last visit reviewed. Allergies and medications reviewed and updated.  Review of Systems  Constitutional: Negative.   Respiratory: Negative.   Cardiovascular: Negative.   Musculoskeletal: Positive for arthralgias and myalgias. Negative for back pain, gait problem, joint swelling, neck pain and neck stiffness.  Skin: Negative.   Psychiatric/Behavioral: Positive for dysphoric mood. Negative for agitation, behavioral problems, confusion, decreased concentration, hallucinations, self-injury, sleep disturbance and suicidal ideas. The patient is nervous/anxious. The patient is not hyperactive.     Per HPI unless specifically indicated above     Objective:  BP 125/82   Pulse 67   Wt 223 lb 2 oz (101.2 kg)   SpO2 97%   BMI 40.81 kg/m   Wt Readings from Last 3 Encounters:  10/26/19 223 lb 2 oz (101.2 kg)  03/26/19 208 lb (94.3 kg)  03/05/19 214 lb (97.1 kg)    Physical Exam Vitals and nursing note reviewed.  Constitutional:      General: She is not in acute distress.    Appearance: Normal appearance. She is not ill-appearing, toxic-appearing or diaphoretic.  HENT:     Head: Normocephalic and atraumatic.     Right Ear: External ear normal.     Left Ear: External ear normal.     Nose: Nose  normal.     Mouth/Throat:     Mouth: Mucous membranes are moist.     Pharynx: Oropharynx is clear.  Eyes:     General: No scleral icterus.       Right eye: No discharge.        Left eye: No discharge.     Extraocular Movements: Extraocular movements intact.     Conjunctiva/sclera: Conjunctivae normal.     Pupils: Pupils are equal, round, and reactive to light.  Cardiovascular:     Rate and Rhythm: Normal rate and regular rhythm.     Pulses: Normal pulses.     Heart sounds: Normal heart sounds. No murmur. No friction rub. No gallop.   Pulmonary:     Effort: Pulmonary effort is normal. No respiratory distress.     Breath sounds: Normal breath sounds. No stridor. No wheezing, rhonchi or rales.  Chest:     Chest wall: No tenderness.  Musculoskeletal:        General: Normal range of motion.     Cervical back: Normal range of motion and neck supple.  Skin:    General: Skin is warm and dry.     Capillary Refill: Capillary refill takes less than 2 seconds.     Coloration: Skin is not jaundiced or pale.     Findings: No bruising, erythema, lesion or rash.  Neurological:     General: No focal deficit present.     Mental Status: She is alert and oriented to person, place, and time. Mental status is at baseline.  Psychiatric:        Mood and Affect: Mood normal.        Behavior: Behavior normal.        Thought Content: Thought content normal.        Judgment: Judgment normal.     Results for orders placed or performed in visit on 10/26/19  CBC with Differential/Platelet  Result Value Ref Range   WBC 8.4 3.4 - 10.8 x10E3/uL   RBC 5.01 3.77 - 5.28 x10E6/uL   Hemoglobin 15.3 11.1 - 15.9 g/dL   Hematocrit 46.4 34.0 - 46.6 %   MCV 93 79 - 97 fL   MCH 30.5 26.6 - 33.0 pg   MCHC 33.0 31.5 - 35.7 g/dL   RDW 12.8 11.7 - 15.4 %   Platelets 237 150 - 450 x10E3/uL   Neutrophils 50 Not Estab. %   Lymphs 40 Not Estab. %   Monocytes 7 Not Estab. %   Eos 2 Not Estab. %   Basos 1 Not Estab.  %   Neutrophils Absolute 4.2 1.4 - 7.0 x10E3/uL   Lymphocytes Absolute 3.3 (H) 0.7 - 3.1 x10E3/uL   Monocytes Absolute 0.6 0.1 - 0.9 x10E3/uL   EOS (ABSOLUTE) 0.2  0.0 - 0.4 x10E3/uL   Basophils Absolute 0.1 0.0 - 0.2 x10E3/uL   Immature Granulocytes 0 Not Estab. %   Immature Grans (Abs) 0.0 0.0 - 0.1 x10E3/uL  TSH  Result Value Ref Range   TSH 1.100 0.450 - 4.500 uIU/mL  VITAMIN D 25 Hydroxy (Vit-D Deficiency, Fractures)  Result Value Ref Range   Vit D, 25-Hydroxy 32.4 30.0 - 100.0 ng/mL      Assessment & Plan:   Problem List Items Addressed This Visit      Other   Anxiety - Primary    Not doing great. Will continue her lexapro and add wellbutrin. Recheck 1 month. Call with any concerns or if not getting better.       Relevant Medications   busPIRone (BUSPAR) 15 MG tablet   escitalopram (LEXAPRO) 20 MG tablet   buPROPion (WELLBUTRIN SR) 150 MG 12 hr tablet   Other Relevant Orders   CBC with Differential/Platelet (Completed)   TSH (Completed)   VITAMIN D 25 Hydroxy (Vit-D Deficiency, Fractures) (Completed)   Depression    Not doing great. Will continue her lexapro and add wellbutrin. Recheck 1 month. Call with any concerns or if not getting better.       Relevant Medications   busPIRone (BUSPAR) 15 MG tablet   escitalopram (LEXAPRO) 20 MG tablet   buPROPion (WELLBUTRIN SR) 150 MG 12 hr tablet   Other Relevant Orders   CBC with Differential/Platelet (Completed)   TSH (Completed)   VITAMIN D 25 Hydroxy (Vit-D Deficiency, Fractures) (Completed)    Other Visit Diagnoses    Strain of right ankle, initial encounter       RICE and exercises. If not getting better, let us know and we'll get an x-ray. Await results.    Dysfunction of right eustachian tube       Will start flonase. Call if not getting better or getting worse. Continue to monitor.        Follow up plan: Return in about 4 weeks (around 11/23/2019) for virtual OK.

## 2019-10-27 LAB — CBC WITH DIFFERENTIAL/PLATELET
Basophils Absolute: 0.1 10*3/uL (ref 0.0–0.2)
Basos: 1 %
EOS (ABSOLUTE): 0.2 10*3/uL (ref 0.0–0.4)
Eos: 2 %
Hematocrit: 46.4 % (ref 34.0–46.6)
Hemoglobin: 15.3 g/dL (ref 11.1–15.9)
Immature Grans (Abs): 0 10*3/uL (ref 0.0–0.1)
Immature Granulocytes: 0 %
Lymphocytes Absolute: 3.3 10*3/uL — ABNORMAL HIGH (ref 0.7–3.1)
Lymphs: 40 %
MCH: 30.5 pg (ref 26.6–33.0)
MCHC: 33 g/dL (ref 31.5–35.7)
MCV: 93 fL (ref 79–97)
Monocytes Absolute: 0.6 10*3/uL (ref 0.1–0.9)
Monocytes: 7 %
Neutrophils Absolute: 4.2 10*3/uL (ref 1.4–7.0)
Neutrophils: 50 %
Platelets: 237 10*3/uL (ref 150–450)
RBC: 5.01 x10E6/uL (ref 3.77–5.28)
RDW: 12.8 % (ref 11.7–15.4)
WBC: 8.4 10*3/uL (ref 3.4–10.8)

## 2019-10-27 LAB — TSH: TSH: 1.1 u[IU]/mL (ref 0.450–4.500)

## 2019-10-27 LAB — VITAMIN D 25 HYDROXY (VIT D DEFICIENCY, FRACTURES): Vit D, 25-Hydroxy: 32.4 ng/mL (ref 30.0–100.0)

## 2019-10-30 ENCOUNTER — Encounter: Payer: Self-pay | Admitting: Family Medicine

## 2019-11-04 ENCOUNTER — Encounter: Payer: Self-pay | Admitting: Family Medicine

## 2019-11-04 NOTE — Assessment & Plan Note (Signed)
Not doing great. Will continue her lexapro and add wellbutrin. Recheck 1 month. Call with any concerns or if not getting better.

## 2019-11-24 ENCOUNTER — Encounter: Payer: Self-pay | Admitting: Family Medicine

## 2019-11-24 ENCOUNTER — Telehealth (INDEPENDENT_AMBULATORY_CARE_PROVIDER_SITE_OTHER): Payer: BC Managed Care – PPO | Admitting: Family Medicine

## 2019-11-24 DIAGNOSIS — F330 Major depressive disorder, recurrent, mild: Secondary | ICD-10-CM | POA: Diagnosis not present

## 2019-11-24 DIAGNOSIS — F419 Anxiety disorder, unspecified: Secondary | ICD-10-CM | POA: Diagnosis not present

## 2019-11-24 MED ORDER — BUPROPION HCL ER (SR) 150 MG PO TB12: 150.0000 mg | ORAL_TABLET | Freq: Two times a day (BID) | ORAL | 1 refills | Status: DC

## 2019-11-24 MED ORDER — TRAZODONE HCL 50 MG PO TABS: 25.0000 mg | ORAL_TABLET | Freq: Every evening | ORAL | 3 refills | Status: DC | PRN

## 2019-11-24 NOTE — Assessment & Plan Note (Signed)
Doing much better but having trouble sleeping. Will continue current regimen and add trazodone. Call with any concerns. Continue to monitor. Refills given.

## 2019-11-24 NOTE — Progress Notes (Signed)
There were no vitals taken for this visit.   Subjective:    Patient ID: Paula Clay, female    DOB: August 09, 1970, 49 y.o.   MRN: [id number]  HPI: Paula Clay is a 49 y.o. female  Chief Complaint  Patient presents with  . Anxiety  . Depression   ANXIETY/DEPRESSION Duration:better Anxious mood: yes  Excessive worrying: yes Irritability: yes  Sweating: no Nausea: no Palpitations:yes Hyperventilation: no Panic attacks: no Agoraphobia: no  Obscessions/compulsions: no Depressed mood: yes Depression screen [facility] 2/9 11/24/2019 10/26/2019 01/13/2019 02/19/2018 02/18/2017  Decreased Interest 0 3 0 0 0  Down, Depressed, Hopeless 0 3 0 0 1  PHQ - 2 Score 0 6 0 0 1  Altered sleeping 3 3 0 0 0  Tired, decreased energy 3 3 0 0 1  Change in appetite 3 3 1  0 0  Feeling bad or failure about yourself  0 2 1 0 0  Trouble concentrating 0 3 0 0 0  Moving slowly or fidgety/restless 0 0 0 0 0  Suicidal thoughts 0 0 0 0 0  PHQ-9 Score 9 20 2  0 2  Difficult doing work/chores Not difficult at all Somewhat difficult - Not difficult at all -   GAD 7 : Generalized Anxiety Score 11/24/2019 10/26/2019 01/13/2019 02/19/2018  Nervous, Anxious, on Edge 1 3 0 0  Control/stop worrying 0 3 0 0  Worry too much - different things 0 3 1 0  Trouble relaxing 1 3 0 0  Restless 0 0 0 0  Easily annoyed or irritable 0 2 1 0  Afraid - awful might happen 0 0 0 0  Total GAD 7 Score 2 14 2  0  Anxiety Difficulty Not difficult at all Somewhat difficult - Not difficult at all   Anhedonia: no Weight changes: no Insomnia: yes hard to stay asleep  Hypersomnia: no Fatigue/loss of energy: yes Feelings of worthlessness: no Feelings of guilt: no Impaired concentration/indecisiveness: no Suicidal ideations: no  Crying spells: no Recent Stressors/Life Changes: no   Relationship problems: no   Family stress: no     Financial stress: no    Job stress: no    Recent death/loss: no  Relevant past medical,  surgical, family and social history reviewed and updated as indicated. Interim medical history since our last visit reviewed. Allergies and medications reviewed and updated.  Review of Systems  Constitutional: Negative.   Respiratory: Negative.   Cardiovascular: Negative.   Gastrointestinal: Negative.   Musculoskeletal: Negative.   Neurological: Negative.   Psychiatric/Behavioral: Positive for sleep disturbance. Negative for agitation, behavioral problems, confusion, decreased concentration, dysphoric mood, hallucinations, self-injury and suicidal ideas. The patient is not nervous/anxious and is not hyperactive.     Per HPI unless specifically indicated above     Objective:    There were no vitals taken for this visit.  Wt Readings from Last 3 Encounters:  10/26/19 223 lb 2 oz (101.2 kg)  03/26/19 208 lb (94.3 kg)  03/05/19 214 lb (97.1 kg)    Physical Exam Vitals and nursing note reviewed.  Constitutional:      General: She is not in acute distress.    Appearance: Normal appearance. She is not ill-appearing, toxic-appearing or diaphoretic.  HENT:     Head: Normocephalic and atraumatic.     Right Ear: External ear normal.     Left Ear: External ear normal.     Nose: Nose normal.     Mouth/Throat:     Mouth: Mucous  membranes are moist.     Pharynx: Oropharynx is clear.  Eyes:     General: No scleral icterus.       Right eye: No discharge.        Left eye: No discharge.     Conjunctiva/sclera: Conjunctivae normal.     Pupils: Pupils are equal, round, and reactive to light.  Pulmonary:     Effort: Pulmonary effort is normal. No respiratory distress.     Comments: Speaking in full sentences Musculoskeletal:        General: Normal range of motion.     Cervical back: Normal range of motion.  Skin:    Coloration: Skin is not jaundiced or pale.     Findings: No bruising, erythema, lesion or rash.  Neurological:     Mental Status: She is alert and oriented to person,  place, and time. Mental status is at baseline.  Psychiatric:        Mood and Affect: Mood normal.        Behavior: Behavior normal.        Thought Content: Thought content normal.        Judgment: Judgment normal.     Results for orders placed or performed in visit on 10/26/19  CBC with Differential/Platelet  Result Value Ref Range   WBC 8.4 3.4 - 10.8 x10E3/uL   RBC 5.01 3.77 - 5.28 x10E6/uL   Hemoglobin 15.3 11.1 - 15.9 g/dL   Hematocrit 46.4 34.0 - 46.6 %   MCV 93 79 - 97 fL   MCH 30.5 26.6 - 33.0 pg   MCHC 33.0 31.5 - 35.7 g/dL   RDW 12.8 11.7 - 15.4 %   Platelets 237 150 - 450 x10E3/uL   Neutrophils 50 Not Estab. %   Lymphs 40 Not Estab. %   Monocytes 7 Not Estab. %   Eos 2 Not Estab. %   Basos 1 Not Estab. %   Neutrophils Absolute 4.2 1.4 - 7.0 x10E3/uL   Lymphocytes Absolute 3.3 (H) 0.7 - 3.1 x10E3/uL   Monocytes Absolute 0.6 0.1 - 0.9 x10E3/uL   EOS (ABSOLUTE) 0.2 0.0 - 0.4 x10E3/uL   Basophils Absolute 0.1 0.0 - 0.2 x10E3/uL   Immature Granulocytes 0 Not Estab. %   Immature Grans (Abs) 0.0 0.0 - 0.1 x10E3/uL  TSH  Result Value Ref Range   TSH 1.100 0.450 - 4.500 uIU/mL  VITAMIN D 25 Hydroxy (Vit-D Deficiency, Fractures)  Result Value Ref Range   Vit D, 25-Hydroxy 32.4 30.0 - 100.0 ng/mL      Assessment & Plan:   Problem List Items Addressed This Visit      Other   Anxiety - Primary    Doing much better but having trouble sleeping. Will continue current regimen and add trazodone. Call with any concerns. Continue to monitor. Refills given.       Relevant Medications   traZODone (DESYREL) 50 MG tablet   buPROPion (WELLBUTRIN SR) 150 MG 12 hr tablet   Depression    Doing much better but having trouble sleeping. Will continue current regimen and add trazodone. Call with any concerns. Continue to monitor. Refills given.       Relevant Medications   traZODone (DESYREL) 50 MG tablet   buPROPion (WELLBUTRIN SR) 150 MG 12 hr tablet       Follow up  plan: Return in about 6 months (around 05/25/2020).     . This visit was completed via MyChart due to the restrictions of the COVID-19  pandemic. All issues as above were discussed and addressed. Physical exam was done as above through visual confirmation on MyChart. If it was felt that the patient should be evaluated in the office, they were directed there. The patient verbally consented to this visit. . Location of the patient: home . Location of the provider: work . Those involved with this call:  . Provider: Park Liter, DO . CMA: Lauretta Grill, RMA . Front Desk/Registration: Don Perking  . Time spent on call: 15 minutes with patient face to face via video conference. More than 50% of this time was spent in counseling and coordination of care. 23 minutes total spent in review of patient's record and preparation of their chart.

## 2019-12-25 ENCOUNTER — Encounter: Payer: Self-pay | Admitting: Family Medicine

## 2020-01-12 NOTE — Patient Instructions (Signed)
Bupropion; Naltrexone extended-release tablets What is this medicine? BUPROPION; NALTREXONE (byoo PROE pee on; nal TREX one) is a combination of two drugs that help you lose weight. This product is used with a reduced calorie diet and exercise. This product can also help you maintain weight loss. This medicine may be used for other purposes; ask your health care provider or pharmacist if you have questions. COMMON BRAND NAME(S): Contrave What should I tell my health care provider before I take this medicine? They need to know if you have any of these conditions:  an eating disorder, such as anorexia or bulimia  diabetes  depression  glaucoma  head injury  heart disease  high blood pressure  history of drug abuse or alcohol abuse problem  history of a tumor or infection of your brain or spine  history of heart attack or stroke  history of irregular heartbeat  if you often drink alcohol  kidney disease  liver disease  low levels of sodium in the blood  mental illness  seizures  suicidal thoughts, plans, or attempt; a previous suicide attempt by you or a family member  taken an MAOI like Carbex, Eldepryl, Marplan, Nardil, or Parnate in last 14 days  an unusual or allergic reaction to bupropion, naltrexone, other medicines, foods, dyes, or preservatives  breast-feeding  pregnant or trying to become pregnant How should I use this medicine? Take this medicine by mouth with a glass of water. Follow the directions on the prescription label. Do not cut, crush or chew this medicine. Swallow the tablets whole. You can take it with or without food. Do not take with high-fat meals as this may increase your risk of seizures. Take your medicine at regular intervals. Do not take it more often than directed. Do not stop taking except on your doctor's advice. A special MedGuide will be given to you by the pharmacist with each prescription and refill. Be sure to read this  information carefully each time. Talk to your pediatrician regarding the use of this medicine in children. Special care may be needed. Overdosage: If you think you have taken too much of this medicine contact a poison control center or emergency room at once. NOTE: This medicine is only for you. Do not share this medicine with others. What if I miss a dose? If you miss a dose, skip it. Take your next dose at the normal time. Do not take extra or 2 doses at the same time to make up for the missed dose. What may interact with this medicine? Do not take this medicine with any of the following medications:  any medicines used to stop taking opioids such as methadone or buprenorphine  linezolid  MAOIs like Carbex, Eldepryl, Marplan, Nardil, and Parnate  methylene blue (injected into a vein)  often take narcotic medicines for pain or cough  other medicines that contain bupropion like Zyban or Wellbutrin This medicine may also interact with the following medications:  alcohol  certain medicines for blood pressure like metoprolol, propranolol  certain medicines for depression, anxiety, or psychotic disturbances  certain medicines for HIV or hepatitis  certain medicines for irregular heart beat like propafenone, flecainide  certain medicines for Parkinson's disease like amantadine, levodopa  certain medicines for seizures like carbamazepine, phenytoin, phenobarbital  certain medicines for sleep  cimetidine  clopidogrel  cyclophosphamide  digoxin  disulfiram  furazolidone  isoniazid  nicotine  orphenadrine  procarbazine  steroid medicines like prednisone or cortisone  stimulant medicines for attention disorders,   weight loss, or to stay awake  tamoxifen  theophylline  thiotepa  ticlopidine  tramadol  warfarin This list may not describe all possible interactions. Give your health care provider a list of all the medicines, herbs, non-prescription drugs, or  dietary supplements you use. Also tell them if you smoke, drink alcohol, or use illegal drugs. Some items may interact with your medicine. What should I watch for while using this medicine? Visit your doctor or healthcare provider for regular checks on your progress. This medicine may cause serious skin reactions. They can happen weeks to months after starting the medicine. Contact your healthcare provider right away if you notice fevers or flu-like symptoms with a rash. The rash may be red or purple and then turn into blisters or peeling of the skin. Or, you might notice a red rash with swelling of the face, lips or lymph nodes in your neck or under your arms. This medicine may affect blood sugar. Ask your healthcare provider if changes in diet or medicines are needed if you have diabetes. Patients and their families should watch out for new or worsening depression or thoughts of suicide. Also watch out for sudden changes in feelings such as feeling anxious, agitated, panicky, irritable, hostile, aggressive, impulsive, severely restless, overly excited and hyperactive, or not being able to sleep. If this happens, especially at the beginning of treatment or after a change in dose, call your healthcare provider. Avoid alcoholic drinks while taking this medicine. Drinking large amounts of alcoholic beverages, using sleeping or anxiety medicines, or quickly stopping the use of these agents while taking this medicine may increase your risk for a seizure. Do not drive or use heavy machinery until you know how this medicine affects you. This medicine can impair your ability to perform these tasks. Women should inform their health care provider if they wish to become pregnant or think they might be pregnant. Losing weight while pregnant is not advised and may cause harm to the unborn child. Talk to your health care provider for more information. What side effects may I notice from receiving this medicine? Side  effects that you should report to your doctor or health care professional as soon as possible:  allergic reactions like skin rash, itching or hives, swelling of the face, lips, or tongue  breathing problems  changes in vision  confusion  elevated mood, decreased need for sleep, racing thoughts, impulsive behavior  fast or irregular heartbeat  hallucinations, loss of contact with reality  increased blood pressure  rash, fever, and swollen lymph nodes  redness, blistering, peeling, or loosening of the skin, including inside the mouth  seizures  signs and symptoms of liver injury like dark yellow or brown urine; general ill feeling or flu-like symptoms; light-colored stools; loss of appetite; nausea; right upper belly pain; unusually weak or tired; yellowing of the eyes or skin  suicidal thoughts or other mood changes  vomiting Side effects that usually do not require medical attention (report to your doctor or health care professional if they continue or are bothersome):  constipation  headache  loss of appetite  indigestion, stomach upset  tremors This list may not describe all possible side effects. Call your doctor for medical advice about side effects. You may report side effects to FDA at 1-800-FDA-1088. Where should I keep my medicine? Keep out of the reach of children. Store at room temperature between 15 and 30 degrees C (59 and 86 degrees F). Throw away any unused medicine after the  expiration date. NOTE: This sheet is a summary. It may not cover all possible information. If you have questions about this medicine, talk to your doctor, pharmacist, or health care provider.  2020 Elsevier/Gold Standard (2019-05-22 36:62:94) Liraglutide injection (Weight Management) What is this medicine? LIRAGLUTIDE (LIR a GLOO tide) is used to help people lose weight and maintain weight loss. It is used with a reduced-calorie diet and exercise. This medicine may be used for  other purposes; ask your health care provider or pharmacist if you have questions. COMMON BRAND NAME(S): Saxenda What should I tell my health care provider before I take this medicine? They need to know if you have any of these conditions:  endocrine tumors (MEN 2) or if someone in your family had these tumors  gallbladder disease  high cholesterol  history of alcohol abuse problem  history of pancreatitis  kidney disease or if you are on dialysis  liver disease  previous swelling of the tongue, face, or lips with difficulty breathing, difficulty swallowing, hoarseness, or tightening of the throat  stomach problems  suicidal thoughts, plans, or attempt; a previous suicide attempt by you or a family member  thyroid cancer or if someone in your family had thyroid cancer  an unusual or allergic reaction to liraglutide, other medicines, foods, dyes, or preservatives  pregnant or trying to get pregnant  breast-feeding How should I use this medicine? This medicine is for injection under the skin of your upper leg, stomach area, or upper arm. You will be taught how to prepare and give this medicine. Use exactly as directed. Take your medicine at regular intervals. Do not take it more often than directed. This drug comes with INSTRUCTIONS FOR USE. Ask your pharmacist for directions on how to use this drug. Read the information carefully. Talk to your pharmacist or health care provider if you have questions. It is important that you put your used needles and syringes in a special sharps container. Do not put them in a trash can. If you do not have a sharps container, call your pharmacist or healthcare provider to get one. A special MedGuide will be given to you by the pharmacist with each prescription and refill. Be sure to read this information carefully each time. Talk to your pediatrician regarding the use of this medicine in children. Special care may be needed. Overdosage: If you  think you have taken too much of this medicine contact a poison control center or emergency room at once. NOTE: This medicine is only for you. Do not share this medicine with others. What if I miss a dose? If you miss a dose, take it as soon as you can. If it is almost time for your next dose, take only that dose. Do not take double or extra doses. If you miss your dose for 3 days or more, call your doctor or health care professional to talk about how to restart this medicine. What may interact with this medicine?  insulin and other medicines for diabetes This list may not describe all possible interactions. Give your health care provider a list of all the medicines, herbs, non-prescription drugs, or dietary supplements you use. Also tell them if you smoke, drink alcohol, or use illegal drugs. Some items may interact with your medicine. What should I watch for while using this medicine? Visit your doctor or health care professional for regular checks on your progress. Drink plenty of fluids while taking this medicine. Check with your doctor or health care professional  if you get an attack of severe diarrhea, nausea, and vomiting. The loss of too much body fluid can make it dangerous for you to take this medicine. This medicine may affect blood sugar levels. Ask your healthcare provider if changes in diet or medicines are needed if you have diabetes. Patients and their families should watch out for worsening depression or thoughts of suicide. Also watch out for sudden changes in feelings such as feeling anxious, agitated, panicky, irritable, hostile, aggressive, impulsive, severely restless, overly excited and hyperactive, or not being able to sleep. If this happens, especially at the beginning of treatment or after a change in dose, call your health care professional. Women should inform their health care provider if they wish to become pregnant or think they might be pregnant. Losing weight while  pregnant is not advised and may cause harm to the unborn child. Talk to your health care provider for more information. What side effects may I notice from receiving this medicine? Side effects that you should report to your doctor or health care professional as soon as possible:  allergic reactions like skin rash, itching or hives, swelling of the face, lips, or tongue  breathing problems  diarrhea that continues or is severe  lump or swelling on the neck  severe nausea  signs and symptoms of infection like fever or chills; cough; sore throat; pain or trouble passing urine  signs and symptoms of low blood sugar such as feeling anxious; confusion; dizziness; increased hunger; unusually weak or tired; increased sweating; shakiness; cold, clammy skin; irritable; headache; blurred vision; fast heartbeat; loss of consciousness  signs and symptoms of kidney injury like trouble passing urine or change in the amount of urine  trouble swallowing  unusual stomach upset or pain  vomiting Side effects that usually do not require medical attention (report to your doctor or health care professional if they continue or are bothersome):  constipation  decreased appetite  diarrhea  fatigue  headache  nausea  pain, redness, or irritation at site where injected  stomach upset  stuffy or runny nose This list may not describe all possible side effects. Call your doctor for medical advice about side effects. You may report side effects to FDA at 1-800-FDA-1088. Where should I keep my medicine? Keep out of the reach of children. Store unopened pen in a refrigerator between 2 and 8 degrees C (36 and 46 degrees F). Do not freeze or use if the medicine has been frozen. Protect from light and excessive heat. After you first use the pen, it can be stored at room temperature between 15 and 30 degrees C (59 and 86 degrees F) or in a refrigerator. Throw away your used pen after 30 days or after the  expiration date, whichever comes first. Do not store your pen with the needle attached. If the needle is left on, medicine may leak from the pen. NOTE: This sheet is a summary. It may not cover all possible information. If you have questions about this medicine, talk to your doctor, pharmacist, or health care provider.  2020 Elsevier/Gold Standard (2019-05-21 21:16:59)

## 2020-01-13 ENCOUNTER — Other Ambulatory Visit: Payer: Self-pay | Admitting: Family Medicine

## 2020-01-13 DIAGNOSIS — Z1211 Encounter for screening for malignant neoplasm of colon: Secondary | ICD-10-CM

## 2020-01-13 NOTE — Progress Notes (Signed)
Ref

## 2020-01-19 ENCOUNTER — Other Ambulatory Visit: Payer: Self-pay

## 2020-01-19 ENCOUNTER — Telehealth: Payer: Self-pay

## 2020-01-19 DIAGNOSIS — Z800 Family history of malignant neoplasm of digestive organs: Secondary | ICD-10-CM

## 2020-01-19 DIAGNOSIS — Z1211 Encounter for screening for malignant neoplasm of colon: Secondary | ICD-10-CM

## 2020-01-19 DIAGNOSIS — Z8371 Family history of colonic polyps: Secondary | ICD-10-CM

## 2020-01-19 NOTE — Telephone Encounter (Signed)
Gastroenterology Pre-Procedure Review  Request Date: Tuesday 02/02/20 Requesting Physician: Dr. Vicente Males  PATIENT REVIEW QUESTIONS: The patient responded to the following health history questions as indicated:    1. Are you having any GI issues? yes (Pain on lower left side, constipatiion.  Pt was offered an appt to discuss prior to scheduling colonosopy.  Pt preferred to proceed with schedulling colonoscopy.) 2. Do you have a personal history of Polyps? no 3. Do you have a family history of Colon Cancer or Polyps? Father colon cancer, both grandmothers had colon polyps 4. Diabetes Mellitus? no 5. Joint replacements in the past 12 months?no 6. Major health problems in the past 3 months?no 7. Any artificial heart valves, MVP, or defibrillator?no    MEDICATIONS & ALLERGIES:    Patient reports the following regarding taking any anticoagulation/antiplatelet therapy:   Plavix, Coumadin, Eliquis, Xarelto, Lovenox, Pradaxa, Brilinta, or Effient? no Aspirin? no  Patient confirms/reports the following medications:  Current Outpatient Medications  Medication Sig Dispense Refill  . acyclovir (ZOVIRAX) 800 MG tablet     . buPROPion (WELLBUTRIN SR) 150 MG 12 hr tablet Take 1 tablet (150 mg total) by mouth 2 (two) times daily. 180 tablet 1  . busPIRone (BUSPAR) 15 MG tablet Take 1 tablet (15 mg total) by mouth daily. 90 tablet 6  . escitalopram (LEXAPRO) 20 MG tablet Take 1 tablet (20 mg total) by mouth daily. 90 tablet 1  . traZODone (DESYREL) 50 MG tablet Take 0.5-1 tablets (25-50 mg total) by mouth at bedtime as needed for sleep. 30 tablet 3   No current facility-administered medications for this visit.    Patient confirms/reports the following allergies:  Allergies  Allergen Reactions  . Lisinopril Cough    No orders of the defined types were placed in this encounter.   AUTHORIZATION INFORMATION Primary Insurance: 1D#: Group #:  Secondary Insurance: 1D#: Group #:  SCHEDULE  INFORMATION: Date: Tuesday 02/02/20 Time: Location:[facility]

## 2020-01-21 MED ORDER — NALTREXONE-BUPROPION HCL ER 8-90 MG PO TB12: ORAL_TABLET | ORAL | 1 refills | Status: DC

## 2020-01-21 NOTE — Addendum Note (Signed)
Addended by: Valerie Roys on: 01/21/2020 08:42 AM   Modules accepted: Orders

## 2020-01-21 NOTE — Telephone Encounter (Signed)
Please schedule follow up appt for about 5-6 weeks out, thanks!

## 2020-01-22 ENCOUNTER — Telehealth: Payer: Self-pay

## 2020-01-22 ENCOUNTER — Other Ambulatory Visit: Payer: Self-pay

## 2020-01-22 DIAGNOSIS — Z800 Family history of malignant neoplasm of digestive organs: Secondary | ICD-10-CM

## 2020-01-22 DIAGNOSIS — Z1211 Encounter for screening for malignant neoplasm of colon: Secondary | ICD-10-CM

## 2020-01-22 NOTE — Telephone Encounter (Signed)
Patients call was returned to reschedule her colonoscopy from Tuesday 07/06 with Dr. Vicente Males due to work schedule.  Colonoscopy has been reschedule to 02/12/20 with Dr. Bonna Gains.  Referral update.  New instructions sent.  Pt advised of COVID test. Endoscopy notified.  Thanks,  [place], [place]

## 2020-01-25 ENCOUNTER — Encounter: Payer: Self-pay | Admitting: Family Medicine

## 2020-01-25 ENCOUNTER — Other Ambulatory Visit: Payer: Self-pay | Admitting: Family Medicine

## 2020-01-25 DIAGNOSIS — Z1231 Encounter for screening mammogram for malignant neoplasm of breast: Secondary | ICD-10-CM

## 2020-01-27 ENCOUNTER — Telehealth: Payer: Self-pay

## 2020-01-27 NOTE — Telephone Encounter (Signed)
Colonoscopy has been rescheduled per patient request.  She has been rescheduled with Dr. Bonna Gains for Friday 02/19/20.  Patient has been advised of new COVID test date Wed 02/17/20.  Almyra Free in Endo has been notified.  Referral has been updated.  New instructions will be mailed to reflect date change.  Thanks,  [place], [place]

## 2020-02-04 ENCOUNTER — Encounter: Payer: Self-pay | Admitting: Family Medicine

## 2020-02-05 ENCOUNTER — Telehealth: Payer: Self-pay

## 2020-02-05 MED ORDER — SAXENDA 18 MG/3ML ~~LOC~~ SOPN: PEN_INJECTOR | SUBCUTANEOUS | 0 refills | Status: DC

## 2020-02-05 MED ORDER — PEN NEEDLES 32G X 4 MM MISC: 1.0000 [IU] | Freq: Every day | 0 refills | Status: DC

## 2020-02-05 NOTE — Telephone Encounter (Signed)
Contrave denied, can Saxenda be sent in?

## 2020-02-05 NOTE — Telephone Encounter (Signed)
Patient notified

## 2020-02-05 NOTE — Telephone Encounter (Signed)
RX sent

## 2020-02-13 ENCOUNTER — Other Ambulatory Visit: Payer: Self-pay | Admitting: Family Medicine

## 2020-02-13 ENCOUNTER — Encounter: Payer: Self-pay | Admitting: Family Medicine

## 2020-02-17 ENCOUNTER — Other Ambulatory Visit
Admission: RE | Admit: 2020-02-17 | Discharge: 2020-02-17 | Disposition: A | Discharge status: Home / Self Care | Payer: BC Managed Care – PPO | Source: Ambulatory Visit | Attending: Gastroenterology | Admitting: Gastroenterology

## 2020-02-17 ENCOUNTER — Other Ambulatory Visit: Payer: Self-pay

## 2020-02-17 DIAGNOSIS — Z20822 Contact with and (suspected) exposure to covid-19: Secondary | ICD-10-CM | POA: Diagnosis not present

## 2020-02-17 DIAGNOSIS — Z01812 Encounter for preprocedural laboratory examination: Secondary | ICD-10-CM | POA: Insufficient documentation

## 2020-02-18 LAB — SARS CORONAVIRUS 2 (TAT 6-24 HRS): SARS Coronavirus 2: NEGATIVE

## 2020-02-19 ENCOUNTER — Encounter
Admission: RE | Disposition: A | Discharge status: Home / Self Care | Payer: Self-pay | Source: Ambulatory Visit | Attending: Gastroenterology

## 2020-02-19 ENCOUNTER — Encounter: Payer: Self-pay | Admitting: Gastroenterology

## 2020-02-19 ENCOUNTER — Other Ambulatory Visit: Payer: Self-pay

## 2020-02-19 ENCOUNTER — Ambulatory Visit
Admission: RE | Admit: 2020-02-19 | Discharge: 2020-02-19 | Disposition: A | Discharge status: Home / Self Care | Payer: BC Managed Care – PPO | Source: Ambulatory Visit | Attending: Gastroenterology | Admitting: Gastroenterology

## 2020-02-19 ENCOUNTER — Ambulatory Visit: Payer: BC Managed Care – PPO | Admitting: Anesthesiology

## 2020-02-19 DIAGNOSIS — F419 Anxiety disorder, unspecified: Secondary | ICD-10-CM | POA: Diagnosis not present

## 2020-02-19 DIAGNOSIS — F329 Major depressive disorder, single episode, unspecified: Secondary | ICD-10-CM | POA: Diagnosis not present

## 2020-02-19 DIAGNOSIS — Z818 Family history of other mental and behavioral disorders: Secondary | ICD-10-CM | POA: Diagnosis not present

## 2020-02-19 DIAGNOSIS — Z8371 Family history of colonic polyps: Secondary | ICD-10-CM

## 2020-02-19 DIAGNOSIS — K635 Polyp of colon: Secondary | ICD-10-CM

## 2020-02-19 DIAGNOSIS — Z79899 Other long term (current) drug therapy: Secondary | ICD-10-CM | POA: Diagnosis not present

## 2020-02-19 DIAGNOSIS — I10 Essential (primary) hypertension: Secondary | ICD-10-CM | POA: Insufficient documentation

## 2020-02-19 DIAGNOSIS — K219 Gastro-esophageal reflux disease without esophagitis: Secondary | ICD-10-CM | POA: Diagnosis not present

## 2020-02-19 DIAGNOSIS — Z820 Family history of epilepsy and other diseases of the nervous system: Secondary | ICD-10-CM | POA: Diagnosis not present

## 2020-02-19 DIAGNOSIS — Z1211 Encounter for screening for malignant neoplasm of colon: Secondary | ICD-10-CM

## 2020-02-19 DIAGNOSIS — E785 Hyperlipidemia, unspecified: Secondary | ICD-10-CM | POA: Insufficient documentation

## 2020-02-19 DIAGNOSIS — Z8052 Family history of malignant neoplasm of bladder: Secondary | ICD-10-CM | POA: Insufficient documentation

## 2020-02-19 DIAGNOSIS — Z90710 Acquired absence of both cervix and uterus: Secondary | ICD-10-CM | POA: Diagnosis not present

## 2020-02-19 DIAGNOSIS — R7303 Prediabetes: Secondary | ICD-10-CM | POA: Diagnosis not present

## 2020-02-19 DIAGNOSIS — Z8349 Family history of other endocrine, nutritional and metabolic diseases: Secondary | ICD-10-CM | POA: Insufficient documentation

## 2020-02-19 DIAGNOSIS — Z833 Family history of diabetes mellitus: Secondary | ICD-10-CM | POA: Diagnosis not present

## 2020-02-19 DIAGNOSIS — L570 Actinic keratosis: Secondary | ICD-10-CM | POA: Insufficient documentation

## 2020-02-19 DIAGNOSIS — Z8249 Family history of ischemic heart disease and other diseases of the circulatory system: Secondary | ICD-10-CM | POA: Diagnosis not present

## 2020-02-19 DIAGNOSIS — Z8262 Family history of osteoporosis: Secondary | ICD-10-CM | POA: Diagnosis not present

## 2020-02-19 DIAGNOSIS — Z888 Allergy status to other drugs, medicaments and biological substances status: Secondary | ICD-10-CM | POA: Diagnosis not present

## 2020-02-19 DIAGNOSIS — Z800 Family history of malignant neoplasm of digestive organs: Secondary | ICD-10-CM | POA: Diagnosis not present

## 2020-02-19 HISTORY — PX: COLONOSCOPY WITH PROPOFOL: SHX5780

## 2020-02-19 SURGERY — COLONOSCOPY WITH PROPOFOL
Anesthesia: General

## 2020-02-19 MED ORDER — SODIUM CHLORIDE 0.9 % IV SOLN: INTRAVENOUS | Status: DC | PRN

## 2020-02-19 MED ORDER — SODIUM CHLORIDE 0.9 % IV SOLN: INTRAVENOUS | Status: DC

## 2020-02-19 MED ORDER — PROPOFOL 10 MG/ML IV BOLUS
INTRAVENOUS | Status: DC | PRN
  Administered 2020-02-19: 70 mg via INTRAVENOUS

## 2020-02-19 MED ORDER — PROPOFOL 500 MG/50ML IV EMUL
INTRAVENOUS | Status: DC | PRN
  Administered 2020-02-19: 200 ug/kg/min via INTRAVENOUS

## 2020-02-19 NOTE — Addendum Note (Signed)
Addendum  created 02/19/20 1411 by Nelda Marseille, CRNA   Intraprocedure Event edited

## 2020-02-19 NOTE — Anesthesia Postprocedure Evaluation (Signed)
Anesthesia Post Note  Patient: Paula Clay  Procedure(s) Performed: COLONOSCOPY WITH PROPOFOL (N/A )  Patient location during evaluation: Endoscopy Anesthesia Type: General Level of consciousness: awake and alert Pain management: pain level controlled Vital Signs Assessment: post-procedure vital signs reviewed and stable Respiratory status: spontaneous breathing, nonlabored ventilation, respiratory function stable and patient connected to nasal cannula oxygen Cardiovascular status: blood pressure returned to baseline and stable Postop Assessment: no apparent nausea or vomiting Anesthetic complications: no   No complications documented.   Last Vitals:  Vitals:   02/19/20 1048 02/19/20 1058  BP: (!) 81/59 122/73  Pulse: 88 91  Resp: 16 17  Temp:    SpO2: 97% 100%    Last Pain:  Vitals:   02/19/20 1108  TempSrc:   PainSc: 0-No pain                 Martha Clan

## 2020-02-19 NOTE — Op Note (Signed)
[facility] Gastroenterology Patient Name: Paula Clay Procedure Date: 02/19/2020 9:50 AM MRN: [id number] Account #: [id number] Date of Birth: [birth year] Admit Type: Outpatient Age: 49 Room: [facility] ENDO ROOM 4 Gender: Female Note Status: Finalized Procedure:             Colonoscopy Indications:           Screening for colorectal malignant neoplasm Providers:             Shanetha Bradham B. Bonna Gains MD, MD Referring MD:          Valerie Roys (Referring MD) Medicines:             Monitored Anesthesia Care Complications:         No immediate complications. Procedure:             Pre-Anesthesia Assessment:                        - ASA Grade Assessment: II - A patient with mild                         systemic disease.                        - Prior to the procedure, a History and Physical was                         performed, and patient medications, allergies and                         sensitivities were reviewed. The patient's tolerance                         of previous anesthesia was reviewed.                        - The risks and benefits of the procedure and the                         sedation options and risks were discussed with the                         patient. All questions were answered and informed                         consent was obtained.                        - Patient identification and proposed procedure were                         verified prior to the procedure by the physician, the                         nurse, the anesthesiologist, the anesthetist and the                         technician. The procedure was verified in the                         procedure room.  After obtaining informed consent, the colonoscope was                         passed under direct vision. Throughout the procedure,                         the patient's blood pressure, pulse, and oxygen                         saturations were monitored  continuously. The                         Colonoscope was introduced through the anus and                         advanced to the the cecum, identified by appendiceal                         orifice and ileocecal valve. The colonoscopy was                         performed with ease. The patient tolerated the                         procedure well. The quality of the bowel preparation                         was fair. Findings:      The perianal and digital rectal examinations were normal.      Two sessile polyps were found in the sigmoid colon. The polyps were 5 to       6 mm in size. These polyps were removed with a cold snare. Resection and       retrieval were complete.      The exam was otherwise without abnormality.      The rectum, sigmoid colon, descending colon, transverse colon, ascending       colon and cecum appeared normal.      The retroflexed view of the distal rectum and anal verge was normal and       showed no anal or rectal abnormalities. Impression:            - Preparation of the colon was fair.                        - Two 5 to 6 mm polyps in the sigmoid colon, removed                         with a cold snare. Resected and retrieved.                        - The examination was otherwise normal.                        - The rectum, sigmoid colon, descending colon,                         transverse colon, ascending colon and cecum are normal.                        -  The distal rectum and anal verge are normal on                         retroflexion view. Recommendation:        - Discharge patient to home (with escort).                        - Advance diet as tolerated.                        - Continue present medications.                        - Await pathology results.                        - Repeat colonoscopy in 5 years.                        - The findings and recommendations were discussed with                         the patient.                         - The findings and recommendations were discussed with                         the patient's family.                        - Return to primary care physician as previously                         scheduled. Procedure Code(s):     --- Professional ---                        [phone], Colonoscopy, flexible; with removal of                         tumor(s), polyp(s), or other lesion(s) by snare                         technique Diagnosis Code(s):     --- Professional ---                        Z12.11, Encounter for screening for malignant neoplasm                         of colon                        K63.5, Polyp of colon CPT copyright 2019 American Medical Association. All rights reserved. The codes documented in this report are preliminary and upon coder review may  be revised to meet current compliance requirements.  Vonda Antigua, MD Margretta Sidle B. Bonna Gains MD, MD 02/19/2020 10:47:44 AM This report has been signed electronically. Number of Addenda: 0 Note Initiated On: 02/19/2020 9:50 AM Scope Withdrawal Time: 0 hours 21 minutes 15 seconds  Total Procedure Duration: 0 hours 24 minutes 53 seconds  Estimated Blood Loss:  Estimated blood loss: none.      [facility]

## 2020-02-19 NOTE — Anesthesia Procedure Notes (Signed)
Date/Time: 02/19/2020 10:23 AM Performed by: Nelda Marseille, CRNA Pre-anesthesia Checklist: Patient identified, Emergency Drugs available, Suction available, Patient being monitored and Timeout performed Oxygen Delivery Method: Nasal cannula

## 2020-02-19 NOTE — Anesthesia Preprocedure Evaluation (Signed)
Anesthesia Evaluation  Patient identified by MRN, date of birth, ID band Patient awake    Reviewed: Allergy & Precautions, H&P , NPO status , Patient's Chart, lab work & pertinent test results, reviewed documented beta blocker date and time   History of Anesthesia Complications Negative for: history of anesthetic complications  Airway Mallampati: I  TM Distance: >3 FB Neck ROM: full    Dental  (+) Caps, Dental Advidsory Given, Teeth Intact   Pulmonary neg pulmonary ROS,    Pulmonary exam normal breath sounds clear to auscultation       Cardiovascular Exercise Tolerance: Good hypertension, (-) angina(-) Past MI and (-) Cardiac Stents Normal cardiovascular exam(-) dysrhythmias + Valvular Problems/Murmurs  Rhythm:regular Rate:Normal     Neuro/Psych PSYCHIATRIC DISORDERS Anxiety Depression negative neurological ROS     GI/Hepatic Neg liver ROS, GERD  ,  Endo/Other  diabetes (borderline)  Renal/GU negative Renal ROS  negative genitourinary   Musculoskeletal   Abdominal   Peds  Hematology negative hematology ROS (+)   Anesthesia Other Findings Past Medical History: No date: Actinic keratosis No date: Anxiety No date: Cancer ([place])     Comment:  skin ca No date: Depression No date: GERD (gastroesophageal reflux disease) No date: GERD (gastroesophageal reflux disease) No date: Hyperlipidemia No date: Hypertension No date: Hypertension No date: Pre-diabetes No date: Thyroid goiter   Reproductive/Obstetrics negative OB ROS                             Anesthesia Physical Anesthesia Plan  ASA: II  Anesthesia Plan: General   Post-op Pain Management:    Induction:   PONV Risk Score and Plan: 3 and Propofol infusion and TIVA  Airway Management Planned: Natural Airway and Nasal Cannula  Additional Equipment:   Intra-op Plan:   Post-operative Plan:   Informed Consent: I have  reviewed the patients History and Physical, chart, labs and discussed the procedure including the risks, benefits and alternatives for the proposed anesthesia with the patient or authorized representative who has indicated his/her understanding and acceptance.     Dental Advisory Given  Plan Discussed with: Anesthesiologist, CRNA and Surgeon  Anesthesia Plan Comments:         Anesthesia Quick Evaluation

## 2020-02-19 NOTE — Transfer of Care (Signed)
Immediate Anesthesia Transfer of Care Note  Patient: Paula Clay  Procedure(s) Performed: COLONOSCOPY WITH PROPOFOL (N/A )  Patient Location: PACU  Anesthesia Type:General  Level of Consciousness: sedated  Airway & Oxygen Therapy: Patient Spontanous Breathing and Patient connected to nasal cannula oxygen  Post-op Assessment: Report given to RN and Post -op Vital signs reviewed and stable  Post vital signs: Reviewed and stable  Last Vitals:  Vitals Value Taken Time  BP 81/59 02/19/20 1050  Temp    Pulse 85 02/19/20 1051  Resp 15 02/19/20 1051  SpO2 97 % 02/19/20 1051  Vitals shown include unvalidated device data.  Last Pain:  Vitals:   02/19/20 1048  TempSrc: Temporal  PainSc: 0-No pain         Complications: No complications documented.

## 2020-02-19 NOTE — H&P (Signed)
Vonda Antigua, MD [address], [place], [place], [place], [address] [address], [place], [place], [place], [address] Phone: [phone]  Fax: [phone]  Primary Care Physician:  Valerie Roys, DO   Pre-Procedure History & Physical: HPI:  Paula Clay is a 49 y.o. female is here for a colonoscopy.   Past Medical History:  Diagnosis Date  . Actinic keratosis   . Anxiety   . Cancer ([place])    skin ca  . Depression   . GERD (gastroesophageal reflux disease)   . GERD (gastroesophageal reflux disease)   . Hyperlipidemia   . Hypertension   . Hypertension   . Pre-diabetes   . Thyroid goiter     Past Surgical History:  Procedure Laterality Date  . ABDOMINAL HYSTERECTOMY  06/2016   total  . BREAST CYST ASPIRATION     ? side-neg  . NASAL SEPTUM SURGERY    . TONSILLECTOMY    . TONSILLECTOMY      Prior to Admission medications   Medication Sig Start Date End Date Taking? Authorizing Provider  buPROPion (WELLBUTRIN SR) 150 MG 12 hr tablet Take 1 tablet (150 mg total) by mouth 2 (two) times daily. 11/24/19  Yes Johnson, [name], DO  busPIRone (BUSPAR) 15 MG tablet Take 1 tablet (15 mg total) by mouth daily. 10/26/19  Yes Johnson, [name], DO  escitalopram (LEXAPRO) 20 MG tablet Take 1 tablet (20 mg total) by mouth daily. 10/26/19  Yes Johnson, [name], DO  Liraglutide -Weight Management (SAXENDA) 18 MG/3ML SOPN Inject 0.1 mLs (0.6 mg total) into the skin daily for 7 days, THEN 0.2 mLs (1.2 mg total) daily for 7 days, THEN 0.3 mLs (1.8 mg total) daily for 7 days, THEN 0.4 mLs (2.4 mg total) daily for 7 days, THEN 0.5 mLs (3 mg total) daily. 02/05/20 06/02/20 Yes Volney American, PA-C  Naltrexone-buPROPion HCl ER 8-90 MG TB12 Start 1 tablet every morning for 7 days, then 1 tablet twice daily for 7 days, then 2 tablets every morning and one in the evening 01/21/20  Yes Johnson, [name], DO  traZODone (DESYREL) 50 MG tablet Take 0.5-1 tablets (25-50 mg total) by  mouth at bedtime as needed for sleep. 11/24/19  Yes Johnson, [name], DO  acyclovir (ZOVIRAX) 800 MG tablet  10/14/19   [provider]  Insulin Pen Needle (PEN NEEDLES) 32G X 4 MM MISC 1 Units by Does not apply route daily. 02/05/20   Volney American, PA-C    Allergies as of 01/20/2020 - Review Complete 11/24/2019  Allergen Reaction Noted  . Lisinopril Cough 12/29/2014    Family History  Problem Relation Age of Onset  . Diabetes Mother   . Hypertension Mother   . Bipolar disorder Mother   . Diabetes Father   . Cancer Father        bladder  . Renal Cyst Father   . Pulmonary Hypertension Father   . Thyroid disease Sister   . Hypertension Maternal Grandmother   . Thyroid disease Maternal Grandmother   . Glaucoma Paternal Grandmother   . Osteoporosis Paternal Grandmother   . Breast cancer Neg Hx     Social History   Socioeconomic History  . Marital status: Divorced    Spouse name: Not on file  . Number of children: Not on file  . Years of education: Not on file  . Highest education level: Not on file  Occupational History  . Not on file  Tobacco Use  .  Smoking status: Never Smoker  . Smokeless tobacco: Never Used  Vaping Use  . Vaping Use: Never used  Substance and Sexual Activity  . Alcohol use: No    Comment: Very Rarely  . Drug use: No  . Sexual activity: Never  Other Topics Concern  . Not on file  Social History Narrative  . Not on file   Social Determinants of Health   Financial Resource Strain:   . Difficulty of Paying Living Expenses:   Food Insecurity:   . Worried About Charity fundraiser in the Last Year:   . Arboriculturist in the Last Year:   Transportation Needs:   . Film/video editor (Medical):   Marland Kitchen Lack of Transportation (Non-Medical):   Physical Activity:   . Days of Exercise per Week:   . Minutes of Exercise per Session:   Stress:   . Feeling of Stress :   Social Connections:   . Frequency of Communication with Friends  and Family:   . Frequency of Social Gatherings with Friends and Family:   . Attends Religious Services:   . Active Member of Clubs or Organizations:   . Attends Archivist Meetings:   Marland Kitchen Marital Status:   Intimate Partner Violence:   . Fear of Current or Ex-Partner:   . Emotionally Abused:   Marland Kitchen Physically Abused:   . Sexually Abused:     Review of Systems: See HPI, otherwise negative ROS  Physical Exam: BP (!) 134/90   Pulse 83   Temp (!) 96.8 F (36 C) (Temporal)   Resp 16   Ht 5' 2.5" (1.588 m)   Wt (!) 96.4 kg   SpO2 98%   BMI 38.24 kg/m  General:   Alert,  pleasant and cooperative in NAD Head:  Normocephalic and atraumatic. Neck:  Supple; no masses or thyromegaly. Lungs:  Clear throughout to auscultation, normal respiratory effort.    Heart:  +S1, +S2, Regular rate and rhythm, No edema. Abdomen:  Soft, nontender and nondistended. Normal bowel sounds, without guarding, and without rebound.   Neurologic:  Alert and  oriented x4;  grossly normal neurologically.  Impression/Plan: Paula Clay is here for a colonoscopy to be performed for average risk screening.  Risks, benefits, limitations, and alternatives regarding  colonoscopy have been reviewed with the patient.  Questions have been answered.  All parties agreeable.   Virgel Manifold, MD  02/19/2020, 9:48 AM

## 2020-02-22 ENCOUNTER — Encounter: Payer: Self-pay | Admitting: Gastroenterology

## 2020-02-22 LAB — SURGICAL PATHOLOGY

## 2020-03-01 ENCOUNTER — Ambulatory Visit: Payer: BC Managed Care – PPO | Admitting: Family Medicine

## 2020-03-10 ENCOUNTER — Ambulatory Visit: Payer: BC Managed Care – PPO

## 2020-03-11 ENCOUNTER — Other Ambulatory Visit: Payer: Self-pay | Admitting: Family Medicine

## 2020-03-16 ENCOUNTER — Ambulatory Visit: Payer: BC Managed Care – PPO

## 2020-03-16 ENCOUNTER — Ambulatory Visit: Payer: BC Managed Care – PPO | Admitting: Family Medicine

## 2020-03-21 ENCOUNTER — Ambulatory Visit
Admission: RE | Admit: 2020-03-21 | Discharge: 2020-03-21 | Disposition: A | Discharge status: Home / Self Care | Payer: BC Managed Care – PPO | Source: Ambulatory Visit | Attending: Family Medicine | Admitting: Family Medicine

## 2020-03-21 ENCOUNTER — Other Ambulatory Visit: Payer: Self-pay

## 2020-03-21 DIAGNOSIS — Z1231 Encounter for screening mammogram for malignant neoplasm of breast: Secondary | ICD-10-CM

## 2020-04-01 ENCOUNTER — Encounter: Payer: BC Managed Care – PPO | Admitting: Family Medicine

## 2020-04-12 ENCOUNTER — Other Ambulatory Visit: Payer: Self-pay

## 2020-04-12 ENCOUNTER — Encounter: Payer: Self-pay | Admitting: Family Medicine

## 2020-04-12 ENCOUNTER — Ambulatory Visit (INDEPENDENT_AMBULATORY_CARE_PROVIDER_SITE_OTHER): Payer: BC Managed Care – PPO | Admitting: Family Medicine

## 2020-04-12 VITALS — BP 118/76 | HR 83 | Temp 98.4°F | Ht 62.0 in | Wt 211.0 lb

## 2020-04-12 DIAGNOSIS — F419 Anxiety disorder, unspecified: Secondary | ICD-10-CM

## 2020-04-12 DIAGNOSIS — Z23 Encounter for immunization: Secondary | ICD-10-CM

## 2020-04-12 DIAGNOSIS — Z0000 Encounter for general adult medical examination without abnormal findings: Secondary | ICD-10-CM

## 2020-04-12 DIAGNOSIS — F330 Major depressive disorder, recurrent, mild: Secondary | ICD-10-CM | POA: Diagnosis not present

## 2020-04-12 DIAGNOSIS — E782 Mixed hyperlipidemia: Secondary | ICD-10-CM

## 2020-04-12 DIAGNOSIS — R7303 Prediabetes: Secondary | ICD-10-CM | POA: Diagnosis not present

## 2020-04-12 DIAGNOSIS — E049 Nontoxic goiter, unspecified: Secondary | ICD-10-CM

## 2020-04-12 MED ORDER — BUSPIRONE HCL 15 MG PO TABS: 15.0000 mg | ORAL_TABLET | Freq: Every day | ORAL | 6 refills | Status: DC

## 2020-04-12 MED ORDER — TRAZODONE HCL 50 MG PO TABS: 25.0000 mg | ORAL_TABLET | Freq: Every evening | ORAL | 1 refills | Status: DC | PRN

## 2020-04-12 MED ORDER — ESCITALOPRAM OXALATE 20 MG PO TABS: 20.0000 mg | ORAL_TABLET | Freq: Every day | ORAL | 1 refills | Status: DC

## 2020-04-12 MED ORDER — BUPROPION HCL ER (SR) 150 MG PO TB12: 150.0000 mg | ORAL_TABLET | Freq: Two times a day (BID) | ORAL | 1 refills | Status: DC

## 2020-04-12 MED ORDER — SAXENDA 18 MG/3ML ~~LOC~~ SOPN: 3.0000 mg | PEN_INJECTOR | Freq: Every day | SUBCUTANEOUS | 1 refills | Status: AC

## 2020-04-12 MED ORDER — PEN NEEDLES 32G X 4 MM MISC: 1.0000 [IU] | Freq: Every day | 0 refills | Status: DC

## 2020-04-12 NOTE — Progress Notes (Signed)
BP 118/76 (BP Location: Right Arm, Patient Position: Sitting)   Pulse 83   Temp 98.4 F (36.9 C) (Oral)   Ht 5\' 2"  (1.575 m)   Wt 211 lb (95.7 kg)   SpO2 93%   BMI 38.59 kg/m    Subjective:    Patient ID: Paula Clay, female    DOB: 1970/11/28, 49 y.o.   MRN: [id number]  HPI: Paula Clay is a 49 y.o. female presenting on 04/12/2020 for comprehensive medical examination. Current medical complaints include:  URINARY SYMPTOMS- has taken 5 doses of amoxicillin 500mg   Duration: 3 days Dysuria: burning Urinary frequency: yes Urgency: yes Small volume voids: no Symptom severity: moderate Urinary incontinence: no Foul odor: no Hematuria: yes Abdominal pain: yes Back pain: yes Suprapubic pain/pressure: no Flank pain: no Fever:  no Vomiting: no Relief with cranberry juice: yes Relief with pyridium: yes Status: better Previous urinary tract infection: no Recurrent urinary tract infection: no Vaginal discharge: no Treatments attempted: antibiotics, pyridium, cranberry and increasing fluids   DEPRESSION Mood status: controlled Satisfied with current treatment?: yes Symptom severity: mild  Duration of current treatment : chronic Side effects: no Medication compliance: excellent compliance Psychotherapy/counseling: no  Previous psychiatric medications: lexapro Depressed mood: yes Anxious mood: no Anhedonia: no Significant weight loss or gain: no Insomnia: no  Fatigue: no Feelings of worthlessness or guilt: no Impaired concentration/indecisiveness: no Suicidal ideations: no Hopelessness: no Crying spells: no Depression screen [facility] 2/9 04/12/2020 11/24/2019 10/26/2019 01/13/2019 02/19/2018  Decreased Interest 0 0 3 0 0  Down, Depressed, Hopeless 0 0 3 0 0  PHQ - 2 Score 0 0 6 0 0  Altered sleeping 0 3 3 0 0  Tired, decreased energy 1 3 3  0 0  Change in appetite 0 3 3 1  0  Feeling bad or failure about yourself  0 0 2 1 0  Trouble concentrating 0 0 3 0 0    Moving slowly or fidgety/restless 0 0 0 0 0  Suicidal thoughts 0 0 0 0 0  PHQ-9 Score 1 9 20 2  0  Difficult doing work/chores Not difficult at all Not difficult at all Somewhat difficult - Not difficult at all   WEIGHT GAIN Duration: chronic Previous attempts at weight loss: yes Complications of obesity: IF, HLD, depression Peak weight: 223lbs Weight loss goal: to be healthy Weight loss to date: 12 pounds Requesting obesity pharmacotherapy: yes Current weight loss supplements/medications: yes  Menopausal Symptoms: no  Depression Screen done today and results listed below:  Depression screen [facility] 2/9 04/12/2020 11/24/2019 10/26/2019 01/13/2019 02/19/2018  Decreased Interest 0 0 3 0 0  Down, Depressed, Hopeless 0 0 3 0 0  PHQ - 2 Score 0 0 6 0 0  Altered sleeping 0 3 3 0 0  Tired, decreased energy 1 3 3  0 0  Change in appetite 0 3 3 1  0  Feeling bad or failure about yourself  0 0 2 1 0  Trouble concentrating 0 0 3 0 0  Moving slowly or fidgety/restless 0 0 0 0 0  Suicidal thoughts 0 0 0 0 0  PHQ-9 Score 1 9 20 2  0  Difficult doing work/chores Not difficult at all Not difficult at all Somewhat difficult - Not difficult at all    Past Medical History:  Past Medical History:  Diagnosis Date  . Actinic keratosis   . Anxiety   . Cancer ([place])    skin ca  . Depression   . GERD (gastroesophageal reflux  disease)   . GERD (gastroesophageal reflux disease)   . Hyperlipidemia   . Hypertension   . Hypertension   . Pre-diabetes   . Thyroid goiter     Surgical History:  Past Surgical History:  Procedure Laterality Date  . ABDOMINAL HYSTERECTOMY  06/2016   total  . BREAST CYST ASPIRATION     ? side-neg  . COLONOSCOPY WITH PROPOFOL N/A 02/19/2020   Procedure: COLONOSCOPY WITH PROPOFOL;  Surgeon: Virgel Manifold, MD;  Location: [facility] ENDOSCOPY;  Service: Gastroenterology;  Laterality: N/A;  . NASAL SEPTUM SURGERY    . TONSILLECTOMY    . TONSILLECTOMY      Medications:   Current Outpatient Medications on File Prior to Visit  Medication Sig  . acyclovir (ZOVIRAX) 800 MG tablet    No current facility-administered medications on file prior to visit.    Allergies:  Allergies  Allergen Reactions  . Lisinopril Cough    Social History:  Social History   Socioeconomic History  . Marital status: Divorced    Spouse name: Not on file  . Number of children: Not on file  . Years of education: Not on file  . Highest education level: Not on file  Occupational History  . Not on file  Tobacco Use  . Smoking status: Never Smoker  . Smokeless tobacco: Never Used  Vaping Use  . Vaping Use: Never used  Substance and Sexual Activity  . Alcohol use: No    Comment: Very Rarely  . Drug use: No  . Sexual activity: Never  Other Topics Concern  . Not on file  Social History Narrative  . Not on file   Social Determinants of Health   Financial Resource Strain:   . Difficulty of Paying Living Expenses: Not on file  Food Insecurity:   . Worried About Charity fundraiser in the Last Year: Not on file  . Ran Out of Food in the Last Year: Not on file  Transportation Needs:   . Lack of Transportation (Medical): Not on file  . Lack of Transportation (Non-Medical): Not on file  Physical Activity:   . Days of Exercise per Week: Not on file  . Minutes of Exercise per Session: Not on file  Stress:   . Feeling of Stress : Not on file  Social Connections:   . Frequency of Communication with Friends and Family: Not on file  . Frequency of Social Gatherings with Friends and Family: Not on file  . Attends Religious Services: Not on file  . Active Member of Clubs or Organizations: Not on file  . Attends Archivist Meetings: Not on file  . Marital Status: Not on file  Intimate Partner Violence:   . Fear of Current or Ex-Partner: Not on file  . Emotionally Abused: Not on file  . Physically Abused: Not on file  . Sexually Abused: Not on file   Social  History   Tobacco Use  Smoking Status Never Smoker  Smokeless Tobacco Never Used   Social History   Substance and Sexual Activity  Alcohol Use No   Comment: Very Rarely    Family History:  Family History  Problem Relation Age of Onset  . Diabetes Mother   . Hypertension Mother   . Bipolar disorder Mother   . Diabetes Father   . Cancer Father        bladder  . Renal Cyst Father   . Pulmonary Hypertension Father   . Thyroid disease Sister   .  Hypertension Maternal Grandmother   . Thyroid disease Maternal Grandmother   . Glaucoma Paternal Grandmother   . Osteoporosis Paternal Grandmother   . Breast cancer Neg Hx     Past medical history, surgical history, medications, allergies, family history and social history reviewed with patient today and changes made to appropriate areas of the chart.   Review of Systems  Constitutional: Negative.   HENT: Negative.        Pressure behind R ear  Eyes: Negative.   Respiratory: Negative.   Cardiovascular: Negative.   Gastrointestinal: Positive for constipation. Negative for abdominal pain, blood in stool, diarrhea, heartburn, melena, nausea and vomiting.  Genitourinary: Positive for dysuria, frequency, hematuria and urgency. Negative for flank pain.  Musculoskeletal: Negative.   Skin: Negative.   Neurological: Negative.   Endo/Heme/Allergies: Negative for environmental allergies and polydipsia. Bruises/bleeds easily.  Psychiatric/Behavioral: Negative.     All other ROS negative except what is listed above and in the HPI.      Objective:    BP 118/76 (BP Location: Right Arm, Patient Position: Sitting)   Pulse 83   Temp 98.4 F (36.9 C) (Oral)   Ht 5\' 2"  (1.575 m)   Wt 211 lb (95.7 kg)   SpO2 93%   BMI 38.59 kg/m   Wt Readings from Last 3 Encounters:  04/12/20 211 lb (95.7 kg)  02/19/20 (!) 212 lb 7 oz (96.4 kg)  10/26/19 223 lb 2 oz (101.2 kg)    Physical Exam Vitals and nursing note reviewed.  Constitutional:       General: She is not in acute distress.    Appearance: Normal appearance. She is not ill-appearing, toxic-appearing or diaphoretic.  HENT:     Head: Normocephalic and atraumatic.     Right Ear: Tympanic membrane, ear canal and external ear normal. There is no impacted cerumen.     Left Ear: Tympanic membrane, ear canal and external ear normal. There is no impacted cerumen.     Nose: Nose normal. No congestion or rhinorrhea.     Mouth/Throat:     Mouth: Mucous membranes are moist.     Pharynx: Oropharynx is clear. No oropharyngeal exudate or posterior oropharyngeal erythema.  Eyes:     General: No scleral icterus.       Right eye: No discharge.        Left eye: No discharge.     Extraocular Movements: Extraocular movements intact.     Conjunctiva/sclera: Conjunctivae normal.     Pupils: Pupils are equal, round, and reactive to light.  Neck:     Vascular: No carotid bruit.  Cardiovascular:     Rate and Rhythm: Normal rate and regular rhythm.     Pulses: Normal pulses.     Heart sounds: No murmur heard.  No friction rub. No gallop.   Pulmonary:     Effort: Pulmonary effort is normal. No respiratory distress.     Breath sounds: Normal breath sounds. No stridor. No wheezing, rhonchi or rales.  Chest:     Chest wall: No tenderness.  Abdominal:     General: Abdomen is flat. Bowel sounds are normal. There is no distension.     Palpations: Abdomen is soft. There is no mass.     Tenderness: There is no abdominal tenderness. There is no right CVA tenderness, left CVA tenderness, guarding or rebound.     Hernia: No hernia is present.  Genitourinary:    Comments: Breast and pelvic exams deferred with shared decision making Musculoskeletal:  General: No swelling, tenderness, deformity or signs of injury.     Cervical back: Normal range of motion and neck supple. No rigidity. No muscular tenderness.     Right lower leg: No edema.     Left lower leg: No edema.  Lymphadenopathy:      Cervical: No cervical adenopathy.  Skin:    General: Skin is warm and dry.     Capillary Refill: Capillary refill takes less than 2 seconds.     Coloration: Skin is not jaundiced or pale.     Findings: No bruising, erythema, lesion or rash.  Neurological:     General: No focal deficit present.     Mental Status: She is alert and oriented to person, place, and time. Mental status is at baseline.     Cranial Nerves: No cranial nerve deficit.     Sensory: No sensory deficit.     Motor: No weakness.     Coordination: Coordination normal.     Gait: Gait normal.     Deep Tendon Reflexes: Reflexes normal.  Psychiatric:        Mood and Affect: Mood normal.        Behavior: Behavior normal.        Thought Content: Thought content normal.        Judgment: Judgment normal.     Results for orders placed or performed in visit on 04/12/20  Comprehensive metabolic panel  Result Value Ref Range   Glucose 87 65 - 99 mg/dL   BUN 15 6 - 24 mg/dL   Creatinine, Ser 0.79 0.57 - 1.00 mg/dL   GFR calc non Af Amer 88 >59 mL/min/1.73   GFR calc Af Amer 101 >59 mL/min/1.73   BUN/Creatinine Ratio 19 9 - 23   Sodium 140 134 - 144 mmol/L   Potassium 4.8 3.5 - 5.2 mmol/L   Chloride 101 96 - 106 mmol/L   CO2 21 20 - 29 mmol/L   Calcium 9.2 8.7 - 10.2 mg/dL   Total Protein 7.2 6.0 - 8.5 g/dL   Albumin 4.4 3.8 - 4.8 g/dL   Globulin, Total 2.8 1.5 - 4.5 g/dL   Albumin/Globulin Ratio 1.6 1.2 - 2.2   Bilirubin Total <0.2 0.0 - 1.2 mg/dL   Alkaline Phosphatase 93 44 - 121 IU/L   AST 17 0 - 40 IU/L   ALT 27 0 - 32 IU/L  Lipid Panel w/o Chol/HDL Ratio  Result Value Ref Range   Cholesterol, Total 240 (H) 100 - 199 mg/dL   Triglycerides 203 (H) 0 - 149 mg/dL   HDL 52 >39 mg/dL   VLDL Cholesterol Cal 37 5 - 40 mg/dL   LDL Chol Calc (NIH) 151 (H) 0 - 99 mg/dL  TSH  Result Value Ref Range   TSH 0.807 0.450 - 4.500 uIU/mL  Urinalysis, Routine w reflex microscopic  Result Value Ref Range   Specific  Gravity, UA 1.020 1.005 - 1.030   pH, UA 5.5 5.0 - 7.5   Color, UA Yellow Yellow   Appearance Ur Clear Clear   Leukocytes,UA Negative Negative   Protein,UA Negative Negative/Trace   Glucose, UA Negative Negative   Ketones, UA Trace (A) Negative   RBC, UA Negative Negative   Bilirubin, UA Negative Negative   Urobilinogen, Ur 0.2 0.2 - 1.0 mg/dL   Nitrite, UA Negative Negative  Bayer DCA Hb A1c Waived  Result Value Ref Range   HB A1C (BAYER DCA - WAIVED) 5.3 <7.0 %  CBC with  Differential/Platelet  Result Value Ref Range   WBC 8.2 3.4 - 10.8 x10E3/uL   RBC 4.77 3.77 - 5.28 x10E6/uL   Hemoglobin 14.9 11.1 - 15.9 g/dL   Hematocrit 44.8 34.0 - 46.6 %   MCV 94 79 - 97 fL   MCH 31.2 26.6 - 33.0 pg   MCHC 33.3 31 - 35 g/dL   RDW 13.1 11.7 - 15.4 %   Platelets 228 150 - 450 x10E3/uL   Neutrophils 53 Not Estab. %   Lymphs 36 Not Estab. %   Monocytes 8 Not Estab. %   Eos 2 Not Estab. %   Basos 1 Not Estab. %   Neutrophils Absolute 4.4 1 - 7 x10E3/uL   Lymphocytes Absolute 3.0 0 - 3 x10E3/uL   Monocytes Absolute 0.7 0 - 0 x10E3/uL   EOS (ABSOLUTE) 0.1 0.0 - 0.4 x10E3/uL   Basophils Absolute 0.1 0 - 0 x10E3/uL   Immature Granulocytes 0 Not Estab. %   Immature Grans (Abs) 0.0 0.0 - 0.1 x10E3/uL      Assessment & Plan:   Problem List Items Addressed This Visit      Endocrine   Thyroid goiter    Rechecking labs today. Await results. Treat as needed.       Relevant Orders   CBC with Differential/Platelet   TSH (Completed)     Other   Hyperlipidemia    Under good control on current regimen. Continue current regimen. Continue to monitor. Call with any concerns. Refills given. Labs drawn today.        Relevant Orders   CBC with Differential/Platelet   Comprehensive metabolic panel (Completed)   Lipid Panel w/o Chol/HDL Ratio (Completed)   Pre-diabetes    Under good control with A1c of 5.3. Continue diet and exercise. Continue to monitor. Call with any concerns.        Relevant Orders   CBC with Differential/Platelet   Urinalysis, Routine w reflex microscopic (Completed)   Bayer DCA Hb A1c Waived (Completed)   Anxiety    Under good control on current regimen. Continue current regimen. Continue to monitor. Call with any concerns. Refills given.        Relevant Medications   buPROPion (WELLBUTRIN SR) 150 MG 12 hr tablet   busPIRone (BUSPAR) 15 MG tablet   escitalopram (LEXAPRO) 20 MG tablet   traZODone (DESYREL) 50 MG tablet   Other Relevant Orders   CBC with Differential/Platelet   Depression    Under good control on current regimen. Continue current regimen. Continue to monitor. Call with any concerns. Refills given.        Relevant Medications   buPROPion (WELLBUTRIN SR) 150 MG 12 hr tablet   busPIRone (BUSPAR) 15 MG tablet   escitalopram (LEXAPRO) 20 MG tablet   traZODone (DESYREL) 50 MG tablet   Other Relevant Orders   CBC with Differential/Platelet   TSH (Completed)    Other Visit Diagnoses    Routine general medical examination at a health care facility    -  Primary   Vaccines up to date. Screening labs checked today. Mammogram and colonoscopy up to date. Continue diet and exercise. Call with any concerns.    Relevant Orders   CBC with Differential/Platelet   Comprehensive metabolic panel (Completed)   Lipid Panel w/o Chol/HDL Ratio (Completed)   TSH (Completed)   Urinalysis, Routine w reflex microscopic (Completed)   Bayer DCA Hb A1c Waived (Completed)   Morbid obesity (H[facility])       Due  to IFG and HLD. Will continue saxenda. Continue to monitor. Call with any concerns.    Relevant Medications   Liraglutide -Weight Management (SAXENDA) 18 MG/3ML SOPN   Need for immunization against influenza       Flu shot given today.   Relevant Orders   Flu Vaccine QUAD 36+ mos IM (Completed)       Follow up plan: Return in about 6 months (around 10/10/2020).   LABORATORY TESTING:  - Pap smear: not applicable  IMMUNIZATIONS:   - Tdap:  Tetanus vaccination status reviewed: last tetanus booster within 10 years. - Influenza: Up to date - Pneumovax: Not applicable - COVID: Up to date  SCREENING: -Mammogram: Up to date  - Colonoscopy: Up to date   PATIENT COUNSELING:   Advised to take 1 mg of folate supplement per day if capable of pregnancy.   Sexuality: Discussed sexually transmitted diseases, partner selection, use of condoms, avoidance of unintended pregnancy  and contraceptive alternatives.   Advised to avoid cigarette smoking.  I discussed with the patient that most people either abstain from alcohol or drink within safe limits (<=14/week and <=4 drinks/occasion for males, <=7/weeks and <= 3 drinks/occasion for females) and that the risk for alcohol disorders and other health effects rises proportionally with the number of drinks per week and how often a drinker exceeds daily limits.  Discussed cessation/primary prevention of drug use and availability of treatment for abuse.   Diet: Encouraged to adjust caloric intake to maintain  or achieve ideal body weight, to reduce intake of dietary saturated fat and total fat, to limit sodium intake by avoiding high sodium foods and not adding table salt, and to maintain adequate dietary potassium and calcium preferably from fresh fruits, vegetables, and low-fat dairy products.    stressed the importance of regular exercise  Injury prevention: Discussed safety belts, safety helmets, smoke detector, smoking near bedding or upholstery.   Dental health: Discussed importance of regular tooth brushing, flossing, and dental visits.    NEXT PREVENTATIVE PHYSICAL DUE IN 1 YEAR. Return in about 6 months (around 10/10/2020).

## 2020-04-12 NOTE — Patient Instructions (Signed)
Health Maintenance, Female Adopting a healthy lifestyle and getting preventive care are important in promoting health and wellness. Ask your health care provider about:  The right schedule for you to have regular tests and exams.  Things you can do on your own to prevent diseases and keep yourself healthy. What should I know about diet, weight, and exercise? Eat a healthy diet   Eat a diet that includes plenty of vegetables, fruits, low-fat dairy products, and lean protein.  Do not eat a lot of foods that are high in solid fats, added sugars, or sodium. Maintain a healthy weight Body mass index (BMI) is used to identify weight problems. It estimates body fat based on height and weight. Your health care provider can help determine your BMI and help you achieve or maintain a healthy weight. Get regular exercise Get regular exercise. This is one of the most important things you can do for your health. Most adults should:  Exercise for at least 150 minutes each week. The exercise should increase your heart rate and make you sweat (moderate-intensity exercise).  Do strengthening exercises at least twice a week. This is in addition to the moderate-intensity exercise.  Spend less time sitting. Even light physical activity can be beneficial. Watch cholesterol and blood lipids Have your blood tested for lipids and cholesterol at 50 years of age, then have this test every 5 years. Have your cholesterol levels checked more often if:  Your lipid or cholesterol levels are high.  You are older than 50 years of age.  You are at high risk for heart disease. What should I know about cancer screening? Depending on your health history and family history, you may need to have cancer screening at various ages. This may include screening for:  Breast cancer.  Cervical cancer.  Colorectal cancer.  Skin cancer.  Lung cancer. What should I know about heart disease, diabetes, and high blood  pressure? Blood pressure and heart disease  High blood pressure causes heart disease and increases the risk of stroke. This is more likely to develop in people who have high blood pressure readings, are of African descent, or are overweight.  Have your blood pressure checked: ? Every 3-5 years if you are 18-39 years of age. ? Every year if you are 40 years old or older. Diabetes Have regular diabetes screenings. This checks your fasting blood sugar level. Have the screening done:  Once every three years after age 40 if you are at a normal weight and have a low risk for diabetes.  More often and at a younger age if you are overweight or have a high risk for diabetes. What should I know about preventing infection? Hepatitis B If you have a higher risk for hepatitis B, you should be screened for this virus. Talk with your health care provider to find out if you are at risk for hepatitis B infection. Hepatitis C Testing is recommended for:  Everyone born from 1945 through 1965.  Anyone with known risk factors for hepatitis C. Sexually transmitted infections (STIs)  Get screened for STIs, including gonorrhea and chlamydia, if: ? You are sexually active and are younger than 50 years of age. ? You are older than 50 years of age and your health care provider tells you that you are at risk for this type of infection. ? Your sexual activity has changed since you were last screened, and you are at increased risk for chlamydia or gonorrhea. Ask your health care provider if   you are at risk.  Ask your health care provider about whether you are at high risk for HIV. Your health care provider may recommend a prescription medicine to help prevent HIV infection. If you choose to take medicine to prevent HIV, you should first get tested for HIV. You should then be tested every 3 months for as long as you are taking the medicine. Pregnancy  If you are about to stop having your period (premenopausal) and  you may become pregnant, seek counseling before you get pregnant.  Take 400 to 800 micrograms (mcg) of folic acid every day if you become pregnant.  Ask for birth control (contraception) if you want to prevent pregnancy. Osteoporosis and menopause Osteoporosis is a disease in which the bones lose minerals and strength with aging. This can result in bone fractures. If you are 65 years old or older, or if you are at risk for osteoporosis and fractures, ask your health care provider if you should:  Be screened for bone loss.  Take a calcium or vitamin D supplement to lower your risk of fractures.  Be given hormone replacement therapy (HRT) to treat symptoms of menopause. Follow these instructions at home: Lifestyle  Do not use any products that contain nicotine or tobacco, such as cigarettes, e-cigarettes, and chewing tobacco. If you need help quitting, ask your health care provider.  Do not use street drugs.  Do not share needles.  Ask your health care provider for help if you need support or information about quitting drugs. Alcohol use  Do not drink alcohol if: ? Your health care provider tells you not to drink. ? You are pregnant, may be pregnant, or are planning to become pregnant.  If you drink alcohol: ? Limit how much you use to 0-1 drink a day. ? Limit intake if you are breastfeeding.  Be aware of how much alcohol is in your drink. In the [place] one drink equals one 12 oz bottle of beer (355 mL), one 5 oz glass of wine (148 mL), or one 1 oz glass of hard liquor (44 mL). General instructions  Schedule regular health, dental, and eye exams.  Stay current with your vaccines.  Tell your health care provider if: ? You often feel depressed. ? You have ever been abused or do not feel safe at home. Summary  Adopting a healthy lifestyle and getting preventive care are important in promoting health and wellness.  Follow your health care provider's instructions about healthy  diet, exercising, and getting tested or screened for diseases.  Follow your health care provider's instructions on monitoring your cholesterol and blood pressure. This information is not intended to replace advice given to you by your health care provider. Make sure you discuss any questions you have with your health care provider. Document Revised: 07/09/2018 Document Reviewed: 07/09/2018 Elsevier Patient Education  2020 [employer] Inc[employer]  

## 2020-04-13 LAB — COMPREHENSIVE METABOLIC PANEL
ALT: 27 IU/L (ref 0–32)
AST: 17 IU/L (ref 0–40)
Albumin/Globulin Ratio: 1.6 (ref 1.2–2.2)
Albumin: 4.4 g/dL (ref 3.8–4.8)
Alkaline Phosphatase: 93 IU/L (ref 44–121)
BUN/Creatinine Ratio: 19 (ref 9–23)
BUN: 15 mg/dL (ref 6–24)
Bilirubin Total: <0.2 mg/dL (ref 0.0–1.2)
CO2: 21 mmol/L (ref 20–29)
Calcium: 9.2 mg/dL (ref 8.7–10.2)
Chloride: 101 mmol/L (ref 96–106)
Creatinine, Ser: 0.79 mg/dL (ref 0.57–1.00)
GFR calc Af Amer: 101 mL/min/{1.73_m2} (ref >59)
GFR calc non Af Amer: 88 mL/min/{1.73_m2} (ref >59)
Globulin, Total: 2.8 g/dL (ref 1.5–4.5)
Glucose: 87 mg/dL (ref 65–99)
Potassium: 4.8 mmol/L (ref 3.5–5.2)
Sodium: 140 mmol/L (ref 134–144)
Total Protein: 7.2 g/dL (ref 6.0–8.5)

## 2020-04-13 LAB — LIPID PANEL W/O CHOL/HDL RATIO
Cholesterol, Total: 240 mg/dL — ABNORMAL HIGH (ref 100–199)
HDL: 52 mg/dL (ref >39)
LDL Chol Calc (NIH): 151 mg/dL — ABNORMAL HIGH (ref 0–99)
Triglycerides: 203 mg/dL — ABNORMAL HIGH (ref 0–149)
VLDL Cholesterol Cal: 37 mg/dL (ref 5–40)

## 2020-04-13 LAB — URINALYSIS, ROUTINE W REFLEX MICROSCOPIC
Bilirubin, UA: NEGATIVE
Glucose, UA: NEGATIVE
Leukocytes,UA: NEGATIVE
Nitrite, UA: NEGATIVE
Protein,UA: NEGATIVE
RBC, UA: NEGATIVE
Specific Gravity, UA: 1.02 (ref 1.005–1.030)
Urobilinogen, Ur: 0.2 mg/dL (ref 0.2–1.0)
pH, UA: 5.5 (ref 5.0–7.5)

## 2020-04-13 LAB — CBC WITH DIFFERENTIAL/PLATELET
Basophils Absolute: 0.1 10*3/uL (ref 0.0–0.2)
Basos: 1 %
EOS (ABSOLUTE): 0.1 10*3/uL (ref 0.0–0.4)
Eos: 2 %
Hematocrit: 44.8 % (ref 34.0–46.6)
Hemoglobin: 14.9 g/dL (ref 11.1–15.9)
Immature Grans (Abs): 0 10*3/uL (ref 0.0–0.1)
Immature Granulocytes: 0 %
Lymphocytes Absolute: 3 10*3/uL (ref 0.7–3.1)
Lymphs: 36 %
MCH: 31.2 pg (ref 26.6–33.0)
MCHC: 33.3 g/dL (ref 31.5–35.7)
MCV: 94 fL (ref 79–97)
Monocytes Absolute: 0.7 10*3/uL (ref 0.1–0.9)
Monocytes: 8 %
Neutrophils Absolute: 4.4 10*3/uL (ref 1.4–7.0)
Neutrophils: 53 %
Platelets: 228 10*3/uL (ref 150–450)
RBC: 4.77 x10E6/uL (ref 3.77–5.28)
RDW: 13.1 % (ref 11.7–15.4)
WBC: 8.2 10*3/uL (ref 3.4–10.8)

## 2020-04-13 LAB — BAYER DCA HB A1C WAIVED: HB A1C (BAYER DCA - WAIVED): 5.3 % (ref <7.0)

## 2020-04-13 LAB — TSH: TSH: 0.807 u[IU]/mL (ref 0.450–4.500)

## 2020-04-13 NOTE — Assessment & Plan Note (Signed)
Under good control with A1c of 5.3. Continue diet and exercise. Continue to monitor. Call with any concerns.

## 2020-04-13 NOTE — Assessment & Plan Note (Signed)
Under good control on current regimen. Continue current regimen. Continue to monitor. Call with any concerns. Refills given.   

## 2020-04-13 NOTE — Assessment & Plan Note (Signed)
Rechecking labs today. Await results. Treat as needed.  °

## 2020-04-13 NOTE — Assessment & Plan Note (Signed)
Under good control on current regimen. Continue current regimen. Continue to monitor. Call with any concerns. Refills given. Labs drawn today.   

## 2020-04-19 ENCOUNTER — Encounter: Payer: Self-pay | Admitting: Family Medicine

## 2020-04-19 ENCOUNTER — Other Ambulatory Visit: Payer: Self-pay | Admitting: Family Medicine

## 2020-04-19 DIAGNOSIS — R300 Dysuria: Secondary | ICD-10-CM

## 2020-05-19 ENCOUNTER — Telehealth: Payer: BC Managed Care – PPO | Admitting: Family Medicine

## 2020-06-13 ENCOUNTER — Telehealth: Payer: Self-pay

## 2020-06-13 NOTE — Telephone Encounter (Signed)
PA started for Saxenda renewal through [employer]. Key [id number]  Waiting on approval

## 2020-06-14 ENCOUNTER — Telehealth: Payer: Self-pay

## 2020-06-14 NOTE — Telephone Encounter (Signed)
PA for [employer] has been approved from 06/13/2020 through 06/13/2021

## 2020-08-04 ENCOUNTER — Telehealth: Payer: Self-pay

## 2020-08-04 NOTE — Telephone Encounter (Signed)
Copied from CRM [id number]. Topic: Complaint - Billing/Coding >> Aug 04, 2020 11:54 AM Milana Obey wrote: DOS: September 14th 2021  Details of complaint: Patient states she received a bill with a coding error  How would the patient like to see this issue resolved? Patient would like have the code updated and resubmitted for billing.    Route to Research officer, political party.

## 2020-09-02 ENCOUNTER — Telehealth: Payer: Self-pay

## 2020-09-02 NOTE — Telephone Encounter (Signed)
Patient would like provider to review medical notes from CPE performed on 04/12/20. Patient states she does not feel the visit warranted an additional office visit because there were no new findings. Please review and advise if OV charge should be removed so patient does not have a $10 copay.

## 2020-10-10 ENCOUNTER — Ambulatory Visit: Payer: BC Managed Care – PPO | Admitting: Family Medicine

## 2020-11-07 IMAGING — MG DIGITAL SCREENING BILATERAL MAMMOGRAM WITH TOMO AND CAD
8 series · 8 of 24 positions shown · non-contrast
Comparison: Previous exam(s).

CLINICAL DATA: Screening.

EXAM:
DIGITAL SCREENING BILATERAL MAMMOGRAM WITH TOMO AND CAD

[R CC synth-2D]
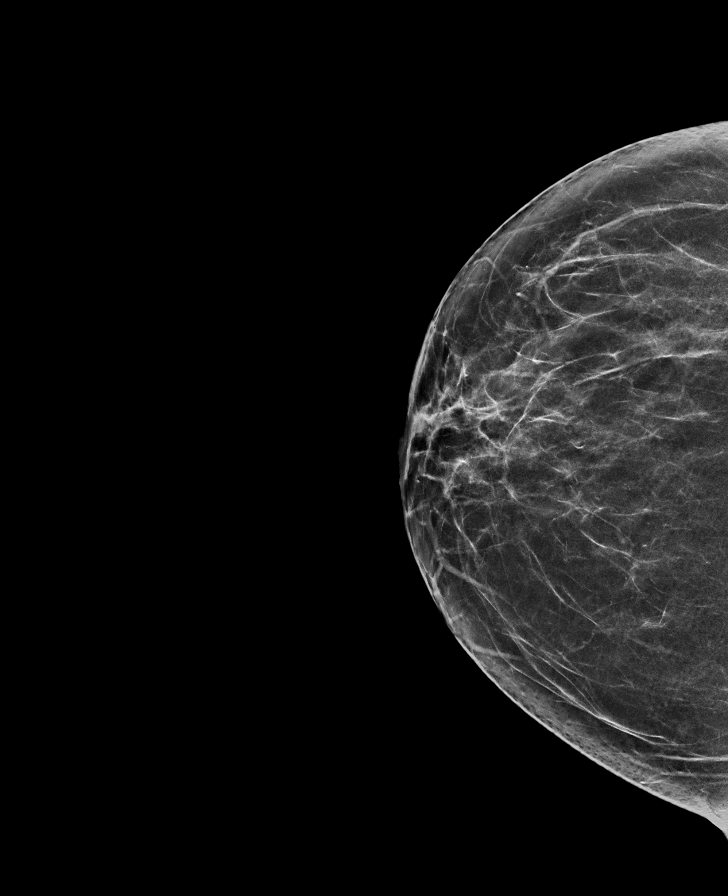

[R MLO synth-2D]
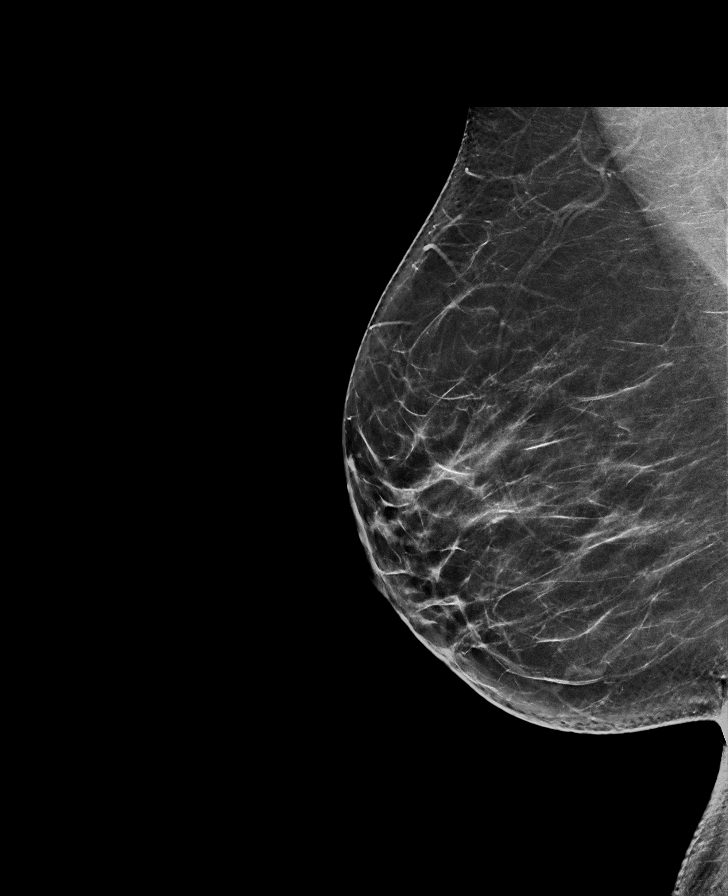

[L MLO synth-2D]
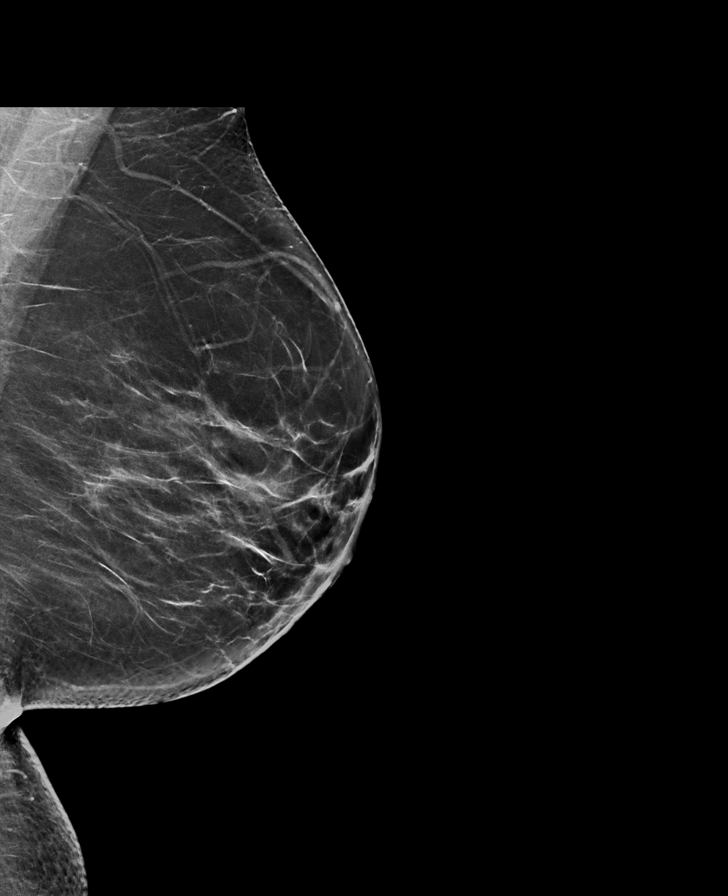

[L CC synth-2D]
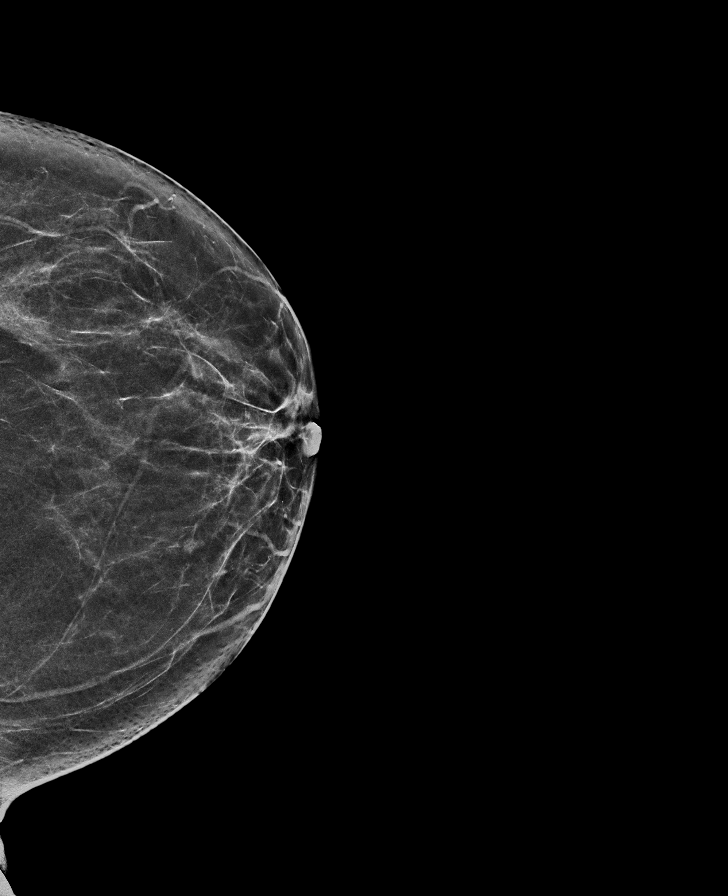

[R MLO tomo · tomo slice 39/78.0]
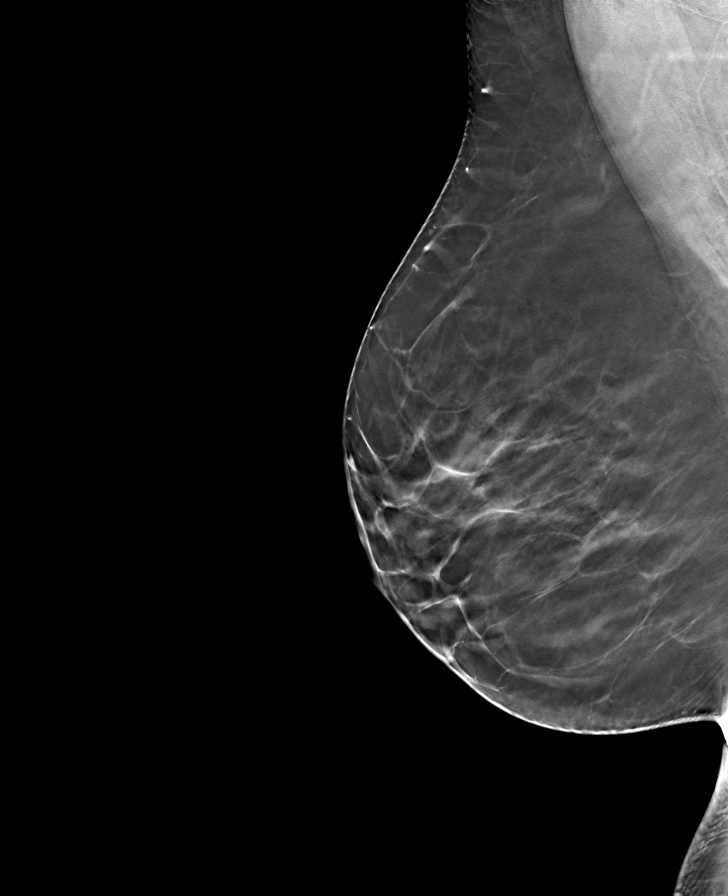

[R CC tomo · tomo slice 34/67.0]
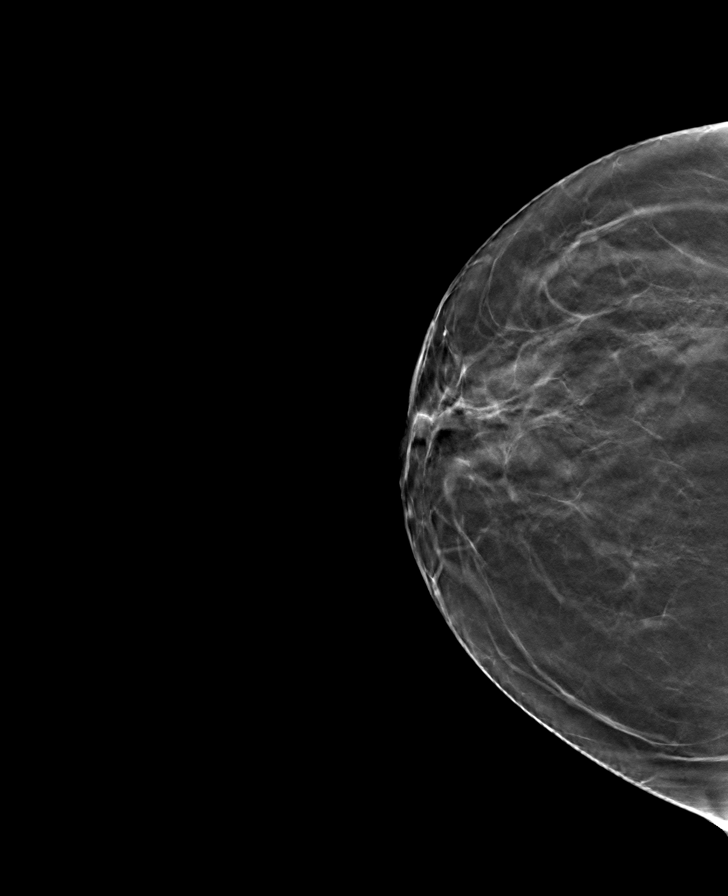

[L CC tomo · tomo slice 34/67.0]
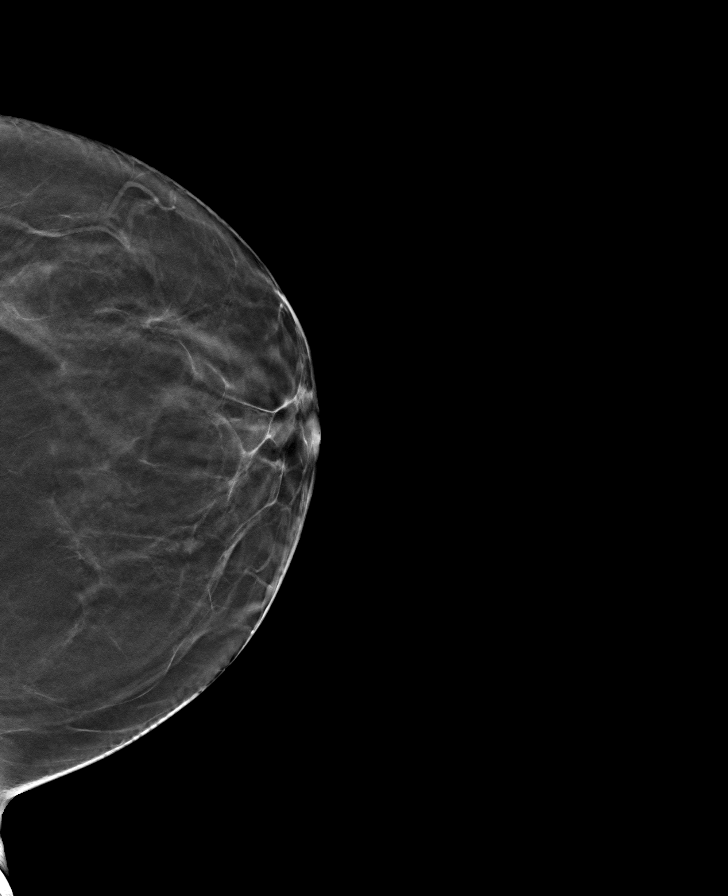

[L MLO tomo · tomo slice 39/77.0]
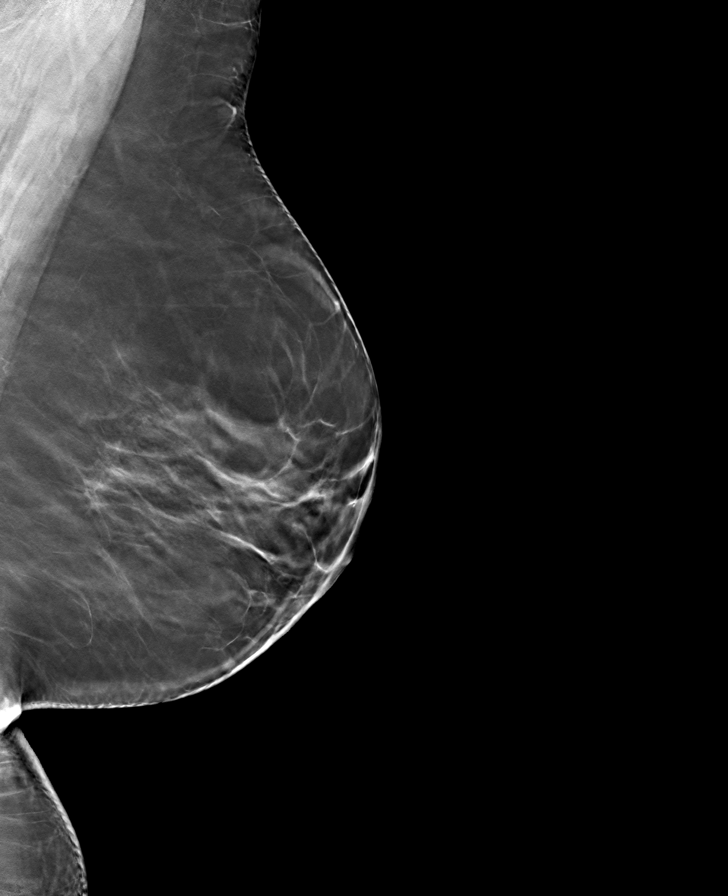

[8 of 24 positions shown; findings below may reference images not displayed]

ACR Breast Density Category b: There are scattered areas of
fibroglandular density.
FINDINGS: There are no findings suspicious for malignancy. Images were
processed with CAD.
IMPRESSION: No mammographic evidence of malignancy. A result letter of this
screening mammogram will be mailed directly to the patient.

RECOMMENDATION:
Screening mammogram in one year. (Code:[id number])

BI-RADS CATEGORY  1: Negative.

## 2020-12-14 ENCOUNTER — Other Ambulatory Visit: Payer: Self-pay | Admitting: Family Medicine

## 2020-12-14 NOTE — Telephone Encounter (Signed)
Requested medication (s) are due for refill today: yes  Requested medication (s) are on the active medication list: yes  Last refill: 08/22/2020  Future visit scheduled: no  Notes to clinic: overdue for follow up  Last appt was canceled   Requested Prescriptions  Pending Prescriptions Disp Refills   escitalopram (LEXAPRO) 20 MG tablet [Pharmacy Med Name: ESCITALOPRAM OXALATE 20 MG TAB] 90 tablet 1    Sig: TAKE 1 TABLET BY MOUTH ONCE DAILY      Psychiatry:  Antidepressants - SSRI Failed - 12/14/2020  9:31 AM      Failed - Valid encounter within last 6 months    Recent Outpatient Visits           8 months ago Routine general medical examination at a health care facility   [place], [place], DO   1 year ago [place], [place], DO   1 year ago [place], [place], DO   1 year ago Routine general medical examination at a health care facility   [facility], [place], DO   1 year ago Swelling of joint   [place], [name], DO                Passed - Completed PHQ-2 or PHQ-9 in the last 360 days

## 2020-12-14 NOTE — Telephone Encounter (Signed)
Pt scheduled for 12/15/2020

## 2020-12-14 NOTE — Telephone Encounter (Signed)
Please get patient scheduled, then let Dr.Johnson know

## 2020-12-15 ENCOUNTER — Ambulatory Visit: Payer: BC Managed Care – PPO | Admitting: Family Medicine

## 2021-01-12 ENCOUNTER — Encounter: Payer: Self-pay | Admitting: Family Medicine

## 2021-01-12 ENCOUNTER — Other Ambulatory Visit: Payer: Self-pay

## 2021-01-12 ENCOUNTER — Ambulatory Visit: Payer: BC Managed Care – PPO | Admitting: Family Medicine

## 2021-01-12 VITALS — BP 101/69 | HR 84 | Temp 98.2°F | Ht 62.5 in | Wt 231.8 lb

## 2021-01-12 DIAGNOSIS — E049 Nontoxic goiter, unspecified: Secondary | ICD-10-CM

## 2021-01-12 DIAGNOSIS — F419 Anxiety disorder, unspecified: Secondary | ICD-10-CM

## 2021-01-12 DIAGNOSIS — R7303 Prediabetes: Secondary | ICD-10-CM

## 2021-01-12 DIAGNOSIS — E782 Mixed hyperlipidemia: Secondary | ICD-10-CM

## 2021-01-12 DIAGNOSIS — F330 Major depressive disorder, recurrent, mild: Secondary | ICD-10-CM

## 2021-01-12 LAB — BAYER DCA HB A1C WAIVED: HB A1C (BAYER DCA - WAIVED): 5.8 % (ref <7.0)

## 2021-01-12 MED ORDER — BUSPIRONE HCL 15 MG PO TABS: 15.0000 mg | ORAL_TABLET | Freq: Every day | ORAL | 6 refills | Status: DC

## 2021-01-12 MED ORDER — SEMAGLUTIDE-WEIGHT MANAGEMENT 1 MG/0.5ML ~~LOC~~ SOAJ: 1.0000 mg | SUBCUTANEOUS | 0 refills | Status: AC

## 2021-01-12 MED ORDER — ESCITALOPRAM OXALATE 20 MG PO TABS: 20.0000 mg | ORAL_TABLET | Freq: Every day | ORAL | 1 refills | Status: DC

## 2021-01-12 MED ORDER — BUPROPION HCL ER (SR) 150 MG PO TB12: 300.0000 mg | ORAL_TABLET | Freq: Two times a day (BID) | ORAL | 1 refills | Status: DC

## 2021-01-12 MED ORDER — SEMAGLUTIDE-WEIGHT MANAGEMENT 2.4 MG/0.75ML ~~LOC~~ SOAJ
2.4000 mg | SUBCUTANEOUS | 3 refills | Status: DC
Start: 2021-05-08 — End: 2021-04-20

## 2021-01-12 MED ORDER — TRAZODONE HCL 50 MG PO TABS: 25.0000 mg | ORAL_TABLET | Freq: Every evening | ORAL | 1 refills | Status: DC | PRN

## 2021-01-12 MED ORDER — SEMAGLUTIDE-WEIGHT MANAGEMENT 0.5 MG/0.5ML ~~LOC~~ SOAJ
0.5000 mg | SUBCUTANEOUS | 0 refills | Status: AC
Start: 2021-02-10 — End: 2021-03-10

## 2021-01-12 MED ORDER — SEMAGLUTIDE-WEIGHT MANAGEMENT 1.7 MG/0.75ML ~~LOC~~ SOAJ: 1.7000 mg | SUBCUTANEOUS | 0 refills | Status: AC

## 2021-01-12 MED ORDER — SEMAGLUTIDE-WEIGHT MANAGEMENT 0.25 MG/0.5ML ~~LOC~~ SOAJ: 0.2500 mg | SUBCUTANEOUS | 0 refills | Status: AC

## 2021-01-12 NOTE — Assessment & Plan Note (Signed)
Under good control on current regimen. Continue current regimen. Continue to monitor. Call with any concerns. Refills given.   

## 2021-01-12 NOTE — Progress Notes (Signed)
BP 101/69   Pulse 84   Temp 98.2 F (36.8 C)   Ht 5' 2.5" (1.588 m)   Wt 231 lb 12.8 oz (105.1 kg)   SpO2 98%   BMI 41.72 kg/m    Subjective:    Patient ID: Paula Clay, female    DOB: 1970/12/17, 50 y.o.   MRN: [id number]  HPI: Paula Clay is a 50 y.o. female  Chief Complaint  Patient presents with   Depression   Anxiety   Ear Pain    Patient states right ear feels like it has fluid    WEIGHT GAIN Duration: chronic Previous attempts at weight loss: yes Complications of obesity: IFG, depression, HLD Peak weight: 231 (current) Weight loss goal: to be healthy Weight loss to date: none Requesting obesity pharmacotherapy: yes Current weight loss supplements/medications: yes- wellbutrin Previous weight loss supplements/meds: yes- wellbutrin and saxenda  ANXIETY/DEPRESSION Duration:controlled Anxious mood: no  Excessive worrying: no Irritability: no  Sweating: no Nausea: no Palpitations:no Hyperventilation: no Panic attacks: no Agoraphobia: no  Obscessions/compulsions: no Depressed mood: no Depression screen [facility] 2/9 01/12/2021 04/12/2020 11/24/2019 10/26/2019 01/13/2019  Decreased Interest 0 0 0 3 0  Down, Depressed, Hopeless 0 0 0 3 0  PHQ - 2 Score 0 0 0 6 0  Altered sleeping 0 0 3 3 0  Tired, decreased energy 0 1 3 3  0  Change in appetite 0 0 3 3 1   Feeling bad or failure about yourself  0 0 0 2 1  Trouble concentrating 0 0 0 3 0  Moving slowly or fidgety/restless 0 0 0 0 0  Suicidal thoughts 0 0 0 0 0  PHQ-9 Score 0 1 9 20 2   Difficult doing work/chores - Not difficult at all Not difficult at all Somewhat difficult -   Anhedonia: no Weight changes: no Insomnia: no   Hypersomnia: no Fatigue/loss of energy: no Feelings of worthlessness: no Feelings of guilt: no Impaired concentration/indecisiveness: no Suicidal ideations: no  Crying spells: no Recent Stressors/Life Changes: no   Relationship problems: no   Family stress: no      Financial stress: no    Job stress: no    Recent death/loss: no  Impaired Fasting Glucose HbA1C:  Lab Results  Component Value Date   HGBA1C 5.8 01/12/2021   Duration of elevated blood sugar: chronic Polydipsia: no Polyuria: no Weight change: yes Visual disturbance: no Glucose Monitoring: no Diabetic Education: Not Completed Family history of diabetes: yes  HYPERLIPIDEMIA Hyperlipidemia status: excellent compliance Satisfied with current treatment?  yes Side effects:  no Medication compliance: excellent compliance Past cholesterol meds: none Supplements: none Aspirin:  no The 10-year ASCVD risk score Mikey Bussing DC Jr., et al., 2013) is: 1.2%   Values used to calculate the score:     Age: 32 years     Sex: Female     Is Non-Hispanic African American: No     Diabetic: No     Tobacco smoker: No     Systolic Blood Pressure: 979 mmHg     Is BP treated: No     HDL Cholesterol: 52 mg/dL     Total Cholesterol: 240 mg/dL Chest pain:  no    Relevant past medical, surgical, family and social history reviewed and updated as indicated. Interim medical history since our last visit reviewed. Allergies and medications reviewed and updated.  Review of Systems  Constitutional: Negative.   Respiratory: Negative.    Cardiovascular: Negative.   Gastrointestinal:  Positive for  constipation and diarrhea. Negative for abdominal distention, abdominal pain, anal bleeding, blood in stool, nausea, rectal pain and vomiting.  Psychiatric/Behavioral: Negative.     Per HPI unless specifically indicated above     Objective:    BP 101/69   Pulse 84   Temp 98.2 F (36.8 C)   Ht 5' 2.5" (1.588 m)   Wt 231 lb 12.8 oz (105.1 kg)   SpO2 98%   BMI 41.72 kg/m   Wt Readings from Last 3 Encounters:  01/12/21 231 lb 12.8 oz (105.1 kg)  04/12/20 211 lb (95.7 kg)  02/19/20 (!) 212 lb 7 oz (96.4 kg)    Physical Exam Vitals and nursing note reviewed.  Constitutional:      General: She is not in  acute distress.    Appearance: Normal appearance. She is not ill-appearing, toxic-appearing or diaphoretic.  HENT:     Head: Normocephalic and atraumatic.     Right Ear: External ear normal.     Left Ear: External ear normal.     Nose: Nose normal.     Mouth/Throat:     Mouth: Mucous membranes are moist.     Pharynx: Oropharynx is clear.  Eyes:     General: No scleral icterus.       Right eye: No discharge.        Left eye: No discharge.     Extraocular Movements: Extraocular movements intact.     Conjunctiva/sclera: Conjunctivae normal.     Pupils: Pupils are equal, round, and reactive to light.  Cardiovascular:     Rate and Rhythm: Normal rate and regular rhythm.     Pulses: Normal pulses.     Heart sounds: Normal heart sounds. No murmur heard.   No friction rub. No gallop.  Pulmonary:     Effort: Pulmonary effort is normal. No respiratory distress.     Breath sounds: Normal breath sounds. No stridor. No wheezing, rhonchi or rales.  Chest:     Chest wall: No tenderness.  Musculoskeletal:        General: Normal range of motion.     Cervical back: Normal range of motion and neck supple.  Skin:    General: Skin is warm and dry.     Capillary Refill: Capillary refill takes less than 2 seconds.     Coloration: Skin is not jaundiced or pale.     Findings: No bruising, erythema, lesion or rash.  Neurological:     General: No focal deficit present.     Mental Status: She is alert and oriented to person, place, and time. Mental status is at baseline.  Psychiatric:        Mood and Affect: Mood normal.        Behavior: Behavior normal.        Thought Content: Thought content normal.        Judgment: Judgment normal.    Results for orders placed or performed in visit on 01/12/21  Bayer DCA Hb A1c Waived  Result Value Ref Range   HB A1C (BAYER DCA - WAIVED) 5.8 <7.0 %      Assessment & Plan:   Problem List Items Addressed This Visit       Endocrine   Thyroid goiter     Rechecking labs today. Await results. Treat as needed.        Relevant Orders   CBC with Differential/Platelet   Comprehensive metabolic panel   TSH     Other   Hyperlipidemia  Rechecking labs today. Await results. Treat as needed.        Relevant Orders   CBC with Differential/Platelet   Comprehensive metabolic panel   Lipid Panel w/o Chol/HDL Ratio   Pre-diabetes    Back up to 5.8 with weight gain. Will start wegovy and recheck after starting it. Call with any concerns.        Relevant Orders   CBC with Differential/Platelet   Comprehensive metabolic panel   Bayer DCA Hb A1c Waived (Completed)   Anxiety    Under good control on current regimen. Continue current regimen. Continue to monitor. Call with any concerns. Refills given.        Relevant Medications   buPROPion (WELLBUTRIN SR) 150 MG 12 hr tablet   busPIRone (BUSPAR) 15 MG tablet   escitalopram (LEXAPRO) 20 MG tablet   traZODone (DESYREL) 50 MG tablet   Other Relevant Orders   CBC with Differential/Platelet   Comprehensive metabolic panel   Depression - Primary    Under good control on current regimen. Continue current regimen. Continue to monitor. Call with any concerns. Refills given.         Relevant Medications   buPROPion (WELLBUTRIN SR) 150 MG 12 hr tablet   busPIRone (BUSPAR) 15 MG tablet   escitalopram (LEXAPRO) 20 MG tablet   traZODone (DESYREL) 50 MG tablet   Other Relevant Orders   CBC with Differential/Platelet   Comprehensive metabolic panel   Morbid obesity ([place])    Has gained back her weight since stopping her saxenda. She was not able to tolerate doing a daily shot. We will try her on wegovy. Recheck 4-6 weeks after starting medicine. Call with any concerns.        Relevant Medications   Semaglutide-Weight Management 0.25 MG/0.5ML SOAJ   Semaglutide-Weight Management 0.5 MG/0.5ML SOAJ (Start on 02/10/2021)   Semaglutide-Weight Management 1 MG/0.5ML SOAJ (Start on 03/11/2021)    Semaglutide-Weight Management 1.7 MG/0.75ML SOAJ (Start on 04/09/2021)   Semaglutide-Weight Management 2.4 MG/0.75ML SOAJ (Start on 05/08/2021)     Follow up plan: Return 6-8 weeks after starting wegovy.

## 2021-01-12 NOTE — Assessment & Plan Note (Signed)
Has gained back her weight since stopping her saxenda. She was not able to tolerate doing a daily shot. We will try her on wegovy. Recheck 4-6 weeks after starting medicine. Call with any concerns.

## 2021-01-12 NOTE — Patient Instructions (Signed)
Lactobacilis and Acidophilus

## 2021-01-12 NOTE — Assessment & Plan Note (Signed)
Rechecking labs today. Await results. Treat as needed.  °

## 2021-01-12 NOTE — Assessment & Plan Note (Signed)
Back up to 5.8 with weight gain. Will start wegovy and recheck after starting it. Call with any concerns.

## 2021-01-13 LAB — COMPREHENSIVE METABOLIC PANEL
ALT: 32 IU/L (ref 0–32)
AST: 22 IU/L (ref 0–40)
Albumin/Globulin Ratio: 1.7 (ref 1.2–2.2)
Albumin: 4.7 g/dL (ref 3.8–4.9)
Alkaline Phosphatase: 109 IU/L (ref 44–121)
BUN/Creatinine Ratio: 17 (ref 9–23)
BUN: 13 mg/dL (ref 6–24)
Bilirubin Total: 0.4 mg/dL (ref 0.0–1.2)
CO2: 24 mmol/L (ref 20–29)
Calcium: 9.6 mg/dL (ref 8.7–10.2)
Chloride: 99 mmol/L (ref 96–106)
Creatinine, Ser: 0.76 mg/dL (ref 0.57–1.00)
Globulin, Total: 2.8 g/dL (ref 1.5–4.5)
Glucose: 100 mg/dL — ABNORMAL HIGH (ref 65–99)
Potassium: 4.7 mmol/L (ref 3.5–5.2)
Sodium: 140 mmol/L (ref 134–144)
Total Protein: 7.5 g/dL (ref 6.0–8.5)
eGFR: 95 mL/min/{1.73_m2} (ref >59)

## 2021-01-13 LAB — CBC WITH DIFFERENTIAL/PLATELET
Basophils Absolute: 0.1 10*3/uL (ref 0.0–0.2)
Basos: 1 %
EOS (ABSOLUTE): 0.2 10*3/uL (ref 0.0–0.4)
Eos: 3 %
Hematocrit: 45 % (ref 34.0–46.6)
Hemoglobin: 15 g/dL (ref 11.1–15.9)
Immature Grans (Abs): 0 10*3/uL (ref 0.0–0.1)
Immature Granulocytes: 1 %
Lymphocytes Absolute: 2.8 10*3/uL (ref 0.7–3.1)
Lymphs: 37 %
MCH: 31 pg (ref 26.6–33.0)
MCHC: 33.3 g/dL (ref 31.5–35.7)
MCV: 93 fL (ref 79–97)
Monocytes Absolute: 0.5 10*3/uL (ref 0.1–0.9)
Monocytes: 7 %
Neutrophils Absolute: 3.9 10*3/uL (ref 1.4–7.0)
Neutrophils: 51 %
Platelets: 263 10*3/uL (ref 150–450)
RBC: 4.84 x10E6/uL (ref 3.77–5.28)
RDW: 13 % (ref 11.7–15.4)
WBC: 7.5 10*3/uL (ref 3.4–10.8)

## 2021-01-13 LAB — LIPID PANEL W/O CHOL/HDL RATIO
Cholesterol, Total: 244 mg/dL — ABNORMAL HIGH (ref 100–199)
HDL: 55 mg/dL (ref >39)
LDL Chol Calc (NIH): 157 mg/dL — ABNORMAL HIGH (ref 0–99)
Triglycerides: 174 mg/dL — ABNORMAL HIGH (ref 0–149)
VLDL Cholesterol Cal: 32 mg/dL (ref 5–40)

## 2021-01-13 LAB — TSH: TSH: 0.708 u[IU]/mL (ref 0.450–4.500)

## 2021-02-10 ENCOUNTER — Telehealth: Payer: Self-pay

## 2021-02-10 NOTE — Telephone Encounter (Signed)
PA initiated via CoverMyMeds for [facility] 0.5MG /0.5ML  Waiting on response. KEY: [id number]

## 2021-02-13 ENCOUNTER — Telehealth: Payer: Self-pay

## 2021-02-13 NOTE — Telephone Encounter (Signed)
PA approved. Patient is aware to call pharmacy to have filled.

## 2021-03-13 ENCOUNTER — Other Ambulatory Visit: Payer: Self-pay | Admitting: Family Medicine

## 2021-03-13 DIAGNOSIS — Z1231 Encounter for screening mammogram for malignant neoplasm of breast: Secondary | ICD-10-CM

## 2021-04-05 ENCOUNTER — Ambulatory Visit
Admission: RE | Admit: 2021-04-05 | Discharge: 2021-04-05 | Disposition: A | Discharge status: Home / Self Care | Payer: BC Managed Care – PPO | Source: Ambulatory Visit | Attending: Family Medicine | Admitting: Family Medicine

## 2021-04-05 ENCOUNTER — Other Ambulatory Visit: Payer: Self-pay

## 2021-04-05 DIAGNOSIS — Z1231 Encounter for screening mammogram for malignant neoplasm of breast: Secondary | ICD-10-CM | POA: Diagnosis not present

## 2021-04-20 ENCOUNTER — Encounter: Payer: Self-pay | Admitting: Family Medicine

## 2021-04-20 ENCOUNTER — Ambulatory Visit (INDEPENDENT_AMBULATORY_CARE_PROVIDER_SITE_OTHER): Payer: BC Managed Care – PPO | Admitting: Family Medicine

## 2021-04-20 ENCOUNTER — Other Ambulatory Visit: Payer: Self-pay

## 2021-04-20 VITALS — BP 122/82 | HR 89 | Temp 98.5°F | Resp 16 | Ht 62.5 in | Wt 230.8 lb

## 2021-04-20 DIAGNOSIS — F330 Major depressive disorder, recurrent, mild: Secondary | ICD-10-CM | POA: Diagnosis not present

## 2021-04-20 DIAGNOSIS — R09A2 Foreign body sensation, throat: Secondary | ICD-10-CM

## 2021-04-20 DIAGNOSIS — E049 Nontoxic goiter, unspecified: Secondary | ICD-10-CM

## 2021-04-20 DIAGNOSIS — R198 Other specified symptoms and signs involving the digestive system and abdomen: Secondary | ICD-10-CM

## 2021-04-20 DIAGNOSIS — Z0000 Encounter for general adult medical examination without abnormal findings: Secondary | ICD-10-CM

## 2021-04-20 DIAGNOSIS — Z23 Encounter for immunization: Secondary | ICD-10-CM

## 2021-04-20 DIAGNOSIS — E782 Mixed hyperlipidemia: Secondary | ICD-10-CM | POA: Diagnosis not present

## 2021-04-20 DIAGNOSIS — R7303 Prediabetes: Secondary | ICD-10-CM

## 2021-04-20 DIAGNOSIS — F419 Anxiety disorder, unspecified: Secondary | ICD-10-CM

## 2021-04-20 DIAGNOSIS — R0989 Other specified symptoms and signs involving the circulatory and respiratory systems: Secondary | ICD-10-CM

## 2021-04-20 LAB — URINALYSIS, ROUTINE W REFLEX MICROSCOPIC
Bilirubin, UA: NEGATIVE
Glucose, UA: NEGATIVE
Leukocytes,UA: NEGATIVE
Nitrite, UA: NEGATIVE
Protein,UA: NEGATIVE
RBC, UA: NEGATIVE
Specific Gravity, UA: 1.01 (ref 1.005–1.030)
Urobilinogen, Ur: 0.2 mg/dL (ref 0.2–1.0)
pH, UA: 6 (ref 5.0–7.5)

## 2021-04-20 LAB — BAYER DCA HB A1C WAIVED: HB A1C (BAYER DCA - WAIVED): 5.6 % (ref 4.8–5.6)

## 2021-04-20 MED ORDER — BUPROPION HCL ER (SR) 150 MG PO TB12: 300.0000 mg | ORAL_TABLET | Freq: Two times a day (BID) | ORAL | 1 refills | Status: DC

## 2021-04-20 MED ORDER — BUSPIRONE HCL 15 MG PO TABS: 15.0000 mg | ORAL_TABLET | Freq: Every day | ORAL | 6 refills | Status: DC

## 2021-04-20 MED ORDER — ESCITALOPRAM OXALATE 20 MG PO TABS: 20.0000 mg | ORAL_TABLET | Freq: Every day | ORAL | 1 refills | Status: DC

## 2021-04-20 MED ORDER — TRAZODONE HCL 50 MG PO TABS: 25.0000 mg | ORAL_TABLET | Freq: Every evening | ORAL | 1 refills | Status: DC | PRN

## 2021-04-20 NOTE — Progress Notes (Signed)
BP 122/82 (BP Location: Left Arm, Patient Position: Sitting, Cuff Size: Large)   Pulse 89   Temp 98.5 F (36.9 C) (Oral)   Resp 16   Ht 5' 2.5" (1.588 m)   Wt 230 lb 12.8 oz (104.7 kg)   SpO2 96%   BMI 41.54 kg/m    Subjective:    Patient ID: Paula Clay, female    DOB: 27-Apr-1971, 50 y.o.   MRN: [id number]  HPI: Paula Clay is a 50 y.o. female presenting on 04/20/2021 for comprehensive medical examination. Current medical complaints include  WEIGHT GAIN Duration: chronic Previous attempts at weight loss: yes Complications of obesity: IFG, HLD Peak weight: current (230) Weight loss goal: to be healthy Weight loss to date: none Requesting obesity pharmacotherapy: yes Current weight loss supplements/medications: yes Previous weight loss supplements/meds: no  DEPRESSION Mood status: controlled Satisfied with current treatment?: yes Symptom severity: mild  Duration of current treatment : chronic Side effects: no Medication compliance: excellent compliance Psychotherapy/counseling: no  Previous psychiatric medications: lexapro, wellbutrin, buspar Depressed mood: no Anxious mood: no Anhedonia: no Significant weight loss or gain: yes Insomnia: no Fatigue: yes Feelings of worthlessness or guilt: no Impaired concentration/indecisiveness: no Suicidal ideations: no Hopelessness: no Crying spells: no Depression screen [facility] 2/9 04/20/2021 01/12/2021 04/12/2020 11/24/2019 10/26/2019  Decreased Interest 0 0 0 0 3  Down, Depressed, Hopeless 0 0 0 0 3  PHQ - 2 Score 0 0 0 0 6  Altered sleeping 0 0 0 3 3  Tired, decreased energy 0 0 _0 Change in appetite 0 0 0 3 3  Feeling bad or failure about yourself  0 0 0 0 2  Trouble concentrating 0 0 0 0 3  Moving slowly or fidgety/restless 0 0 0 0 0  Suicidal thoughts 0 0 0 0 0  PHQ-9 Score 0 0 _1 Difficult doing work/chores Not difficult at all - Not difficult at all Not difficult at all Somewhat difficult  Some  recent data might be hidden   HYPERLIPIDEMIA Hyperlipidemia status: excellent compliance Satisfied with current treatment?  yes Side effects:  not on anything Past cholesterol meds: none Supplements: none Aspirin:  no The 10-year ASCVD risk score (Arnett DK, et al., 2019) is: 1.6%   Values used to calculate the score:     Age: 27 years     Sex: Female     Is Non-Hispanic African American: No     Diabetic: No     Tobacco smoker: No     Systolic Blood Pressure: 409 mmHg     Is BP treated: No     HDL Cholesterol: 52 mg/dL     Total Cholesterol: 233 mg/dL Chest pain:  no Coronary artery disease:  no  Impaired Fasting Glucose HbA1C:  Lab Results  Component Value Date   HGBA1C 5.6 04/20/2021   Duration of elevated blood sugar: chronic Polydipsia: no Polyuria: no Weight change: no Visual disturbance: no Glucose Monitoring: no Diabetic Education: Not Completed Family history of diabetes: no  Menopausal Symptoms: no  Depression Screen done today and results listed below:  Depression screen [facility] 2/9 04/20/2021 01/12/2021 04/12/2020 11/24/2019 10/26/2019  Decreased Interest 0 0 0 0 3  Down, Depressed, Hopeless 0 0 0 0 3  PHQ - 2 Score 0 0 0 0 6  Altered sleeping 0 0 0 3 3  Tired, decreased energy 0 0 _2 Change in appetite 0 0 0 3 3  Feeling bad or failure about yourself  0 0 0 0 2  Trouble concentrating 0 0 0 0 3  Moving slowly or fidgety/restless 0 0 0 0 0  Suicidal thoughts 0 0 0 0 0  PHQ-9 Score 0 0 _0 Difficult doing work/chores Not difficult at all - Not difficult at all Not difficult at all Somewhat difficult  Some recent data might be hidden    Past Medical History:  Past Medical History:  Diagnosis Date   Actinic keratosis    Anxiety    Cancer ([employer])    skin ca   Depression    GERD (gastroesophageal reflux disease)    GERD (gastroesophageal reflux disease)    Hyperlipidemia    Hypertension    Hypertension    Pre-diabetes    Thyroid goiter      Surgical History:  Past Surgical History:  Procedure Laterality Date   ABDOMINAL HYSTERECTOMY  06/2016   total   BREAST CYST ASPIRATION     ? side-neg   COLONOSCOPY WITH PROPOFOL N/A 02/19/2020   Procedure: COLONOSCOPY WITH PROPOFOL;  Surgeon: Virgel Manifold, MD;  Location: [facility] ENDOSCOPY;  Service: Gastroenterology;  Laterality: N/A;   NASAL SEPTUM SURGERY     TONSILLECTOMY     TONSILLECTOMY      Medications:  Current Outpatient Medications on File Prior to Visit  Medication Sig   acyclovir (ZOVIRAX) 800 MG tablet Take 800 mg by mouth as needed.   Semaglutide-Weight Management 1.7 MG/0.75ML SOAJ Inject 1.7 mg into the skin once a week for 28 days.   Insulin Pen Needle (PEN NEEDLES) 32G X 4 MM MISC 1 Units by Does not apply route daily. (Patient not taking: Reported on 04/20/2021)   No current facility-administered medications on file prior to visit.    Allergies:  Allergies  Allergen Reactions   Lisinopril Cough    Social History:  Social History   Socioeconomic History   Marital status: Married    Spouse name: Not on file   Number of children: Not on file   Years of education: Not on file   Highest education level: Not on file  Occupational History   Not on file  Tobacco Use   Smoking status: Never   Smokeless tobacco: Never  Vaping Use   Vaping Use: Never used  Substance and Sexual Activity   Alcohol use: No    Comment: Very Rarely   Drug use: No   Sexual activity: Not Currently  Other Topics Concern   Not on file  Social History Narrative   Not on file   Social Determinants of Health   Financial Resource Strain: Not on file  Food Insecurity: Not on file  Transportation Needs: Not on file  Physical Activity: Not on file  Stress: Not on file  Social Connections: Not on file  Intimate Partner Violence: Not on file   Social History   Tobacco Use  Smoking Status Never  Smokeless Tobacco Never   Social History   Substance and Sexual  Activity  Alcohol Use No   Comment: Very Rarely    Family History:  Family History  Problem Relation Age of Onset   Diabetes Mother    Hypertension Mother    Bipolar disorder Mother    COPD Father    Diabetes Father    Cancer Father        bladder   Renal Cyst Father    Pulmonary Hypertension Father    Thyroid disease Sister  Hypertension Maternal Grandmother    Thyroid disease Maternal Grandmother    Glaucoma Paternal Grandmother    Osteoporosis Paternal Grandmother    Breast cancer Neg Hx     Past medical history, surgical history, medications, allergies, family history and social history reviewed with patient today and changes made to appropriate areas of the chart.   Review of Systems  Constitutional: Negative.   HENT: Negative.         +globus sensation  Eyes: Negative.   Respiratory: Negative.    Cardiovascular: Negative.   Gastrointestinal:  Positive for constipation. Negative for abdominal pain, blood in stool, diarrhea, heartburn, melena, nausea and vomiting.       + trouble swallowing  Genitourinary: Negative.   Musculoskeletal: Negative.   Skin: Negative.   Neurological: Negative.   Endo/Heme/Allergies: Negative.   Psychiatric/Behavioral: Negative.    All other ROS negative except what is listed above and in the HPI.      Objective:    BP 122/82 (BP Location: Left Arm, Patient Position: Sitting, Cuff Size: Large)   Pulse 89   Temp 98.5 F (36.9 C) (Oral)   Resp 16   Ht 5' 2.5" (1.588 m)   Wt 230 lb 12.8 oz (104.7 kg)   SpO2 96%   BMI 41.54 kg/m   Wt Readings from Last 3 Encounters:  04/20/21 230 lb 12.8 oz (104.7 kg)  01/12/21 231 lb 12.8 oz (105.1 kg)  04/12/20 211 lb (95.7 kg)    Physical Exam Vitals and nursing note reviewed.  Constitutional:      General: She is not in acute distress.    Appearance: Normal appearance. She is not ill-appearing, toxic-appearing or diaphoretic.  HENT:     Head: Normocephalic and atraumatic.     Right  Ear: Tympanic membrane, ear canal and external ear normal. There is no impacted cerumen.     Left Ear: Tympanic membrane, ear canal and external ear normal. There is no impacted cerumen.     Nose: Nose normal. No congestion or rhinorrhea.     Mouth/Throat:     Mouth: Mucous membranes are moist.     Pharynx: Oropharynx is clear. No oropharyngeal exudate or posterior oropharyngeal erythema.  Eyes:     General: No scleral icterus.       Right eye: No discharge.        Left eye: No discharge.     Extraocular Movements: Extraocular movements intact.     Conjunctiva/sclera: Conjunctivae normal.     Pupils: Pupils are equal, round, and reactive to light.  Neck:     Vascular: No carotid bruit.     Comments: +thyromegaly Cardiovascular:     Rate and Rhythm: Normal rate and regular rhythm.     Pulses: Normal pulses.     Heart sounds: No murmur heard.   No friction rub. No gallop.  Pulmonary:     Effort: Pulmonary effort is normal. No respiratory distress.     Breath sounds: Normal breath sounds. No stridor. No wheezing, rhonchi or rales.  Chest:     Chest wall: No tenderness.  Abdominal:     General: Abdomen is flat. Bowel sounds are normal. There is no distension.     Palpations: Abdomen is soft. There is no mass.     Tenderness: There is no abdominal tenderness. There is no right CVA tenderness, left CVA tenderness, guarding or rebound.     Hernia: No hernia is present.  Genitourinary:    Comments: Breast and pelvic  exams deferred with shared decision making Musculoskeletal:        General: No swelling, tenderness, deformity or signs of injury.     Cervical back: Normal range of motion and neck supple. No rigidity. No muscular tenderness.     Right lower leg: No edema.     Left lower leg: No edema.  Lymphadenopathy:     Cervical: No cervical adenopathy.  Skin:    General: Skin is warm and dry.     Capillary Refill: Capillary refill takes less than 2 seconds.     Coloration: Skin is  not jaundiced or pale.     Findings: No bruising, erythema, lesion or rash.  Neurological:     General: No focal deficit present.     Mental Status: She is alert and oriented to person, place, and time. Mental status is at baseline.     Cranial Nerves: No cranial nerve deficit.     Sensory: No sensory deficit.     Motor: No weakness.     Coordination: Coordination normal.     Gait: Gait normal.     Deep Tendon Reflexes: Reflexes normal.  Psychiatric:        Mood and Affect: Mood normal.        Behavior: Behavior normal.        Thought Content: Thought content normal.        Judgment: Judgment normal.    Results for orders placed or performed in visit on 04/20/21  CBC with Differential/Platelet  Result Value Ref Range   WBC 9.2 3.4 - 10.8 x10E3/uL   RBC 4.82 3.77 - 5.28 x10E6/uL   Hemoglobin 14.9 11.1 - 15.9 g/dL   Hematocrit 45.2 34.0 - 46.6 %   MCV 94 79 - 97 fL   MCH 30.9 26.6 - 33.0 pg   MCHC 33.0 31.5 - 35.7 g/dL   RDW 12.9 11.7 - 15.4 %   Platelets 254 150 - 450 x10E3/uL   Neutrophils 53 Not Estab. %   Lymphs 37 Not Estab. %   Monocytes 7 Not Estab. %   Eos 2 Not Estab. %   Basos 1 Not Estab. %   Neutrophils Absolute 4.8 1.4 - 7.0 x10E3/uL   Lymphocytes Absolute 3.4 (H) 0.7 - 3.1 x10E3/uL   Monocytes Absolute 0.7 0.1 - 0.9 x10E3/uL   EOS (ABSOLUTE) 0.2 0.0 - 0.4 x10E3/uL   Basophils Absolute 0.1 0.0 - 0.2 x10E3/uL   Immature Granulocytes 0 Not Estab. %   Immature Grans (Abs) 0.0 0.0 - 0.1 x10E3/uL  Comprehensive metabolic panel  Result Value Ref Range   Glucose 87 65 - 99 mg/dL   BUN 14 6 - 24 mg/dL   Creatinine, Ser 0.97 0.57 - 1.00 mg/dL   eGFR 71 >59 mL/min/1.73   BUN/Creatinine Ratio 14 9 - 23   Sodium 139 134 - 144 mmol/L   Potassium 4.5 3.5 - 5.2 mmol/L   Chloride 98 96 - 106 mmol/L   CO2 25 20 - 29 mmol/L   Calcium 9.4 8.7 - 10.2 mg/dL   Total Protein 7.3 6.0 - 8.5 g/dL   Albumin 4.7 3.8 - 4.9 g/dL   Globulin, Total 2.6 1.5 - 4.5 g/dL    Albumin/Globulin Ratio 1.8 1.2 - 2.2   Bilirubin Total 0.3 0.0 - 1.2 mg/dL   Alkaline Phosphatase 100 44 - 121 IU/L   AST 22 0 - 40 IU/L   ALT 24 0 - 32 IU/L  Lipid Panel w/o Chol/HDL Ratio  Result  Value Ref Range   Cholesterol, Total 233 (H) 100 - 199 mg/dL   Triglycerides 138 0 - 149 mg/dL   HDL 52 >39 mg/dL   VLDL Cholesterol Cal 25 5 - 40 mg/dL   LDL Chol Calc (NIH) 156 (H) 0 - 99 mg/dL  Urinalysis, Routine w reflex microscopic  Result Value Ref Range   Specific Gravity, UA 1.010 1.005 - 1.030   pH, UA 6.0 5.0 - 7.5   Color, UA Yellow Yellow   Appearance Ur Clear Clear   Leukocytes,UA Negative Negative   Protein,UA Negative Negative/Trace   Glucose, UA Negative Negative   Ketones, UA Trace (A) Negative   RBC, UA Negative Negative   Bilirubin, UA Negative Negative   Urobilinogen, Ur 0.2 0.2 - 1.0 mg/dL   Nitrite, UA Negative Negative  TSH  Result Value Ref Range   TSH 0.776 0.450 - 4.500 uIU/mL  Hepatitis C Antibody  Result Value Ref Range   Hep C Virus Ab <0.1 0.0 - 0.9 s/co ratio  Bayer DCA Hb A1c Waived  Result Value Ref Range   HB A1C (BAYER DCA - WAIVED) 5.6 4.8 - 5.6 %      Assessment & Plan:   Problem List Items Addressed This Visit       Endocrine   Thyroid goiter    Will check thyroid US. Await results. Treat as needed.       Relevant Orders   US THYROID     Other   Hyperlipidemia    Rechecking labs today. Await results. Treat as needed.        Pre-diabetes    Rechecking labs today. Await results. Treat as needed.       Relevant Orders   Bayer DCA Hb A1c Waived   Anxiety    Under good control on current regimen. Continue current regimen. Continue to monitor. Call with any concerns. Refills given. Labs drawn today.       Relevant Medications   traZODone (DESYREL) 50 MG tablet   escitalopram (LEXAPRO) 20 MG tablet   busPIRone (BUSPAR) 15 MG tablet   buPROPion (WELLBUTRIN SR) 150 MG 12 hr tablet   Depression    Under good control on  current regimen. Continue current regimen. Continue to monitor. Call with any concerns. Refills given. Labs drawn today.       Relevant Medications   traZODone (DESYREL) 50 MG tablet   escitalopram (LEXAPRO) 20 MG tablet   busPIRone (BUSPAR) 15 MG tablet   buPROPion (WELLBUTRIN SR) 150 MG 12 hr tablet   Morbid obesity (HCC)    Will start ozempic. Call with any concerns. Continue to monitor.       Other Visit Diagnoses     Routine general medical examination at a health care facility    -  Primary   Vaccines up to date. Screening labs checked today. Colonoscopy up to date. Mammogram up to date. Continue diet and exercise. Call with any concerns.    Relevant Orders   CBC with Differential/Platelet (Completed)   Comprehensive metabolic panel (Completed)   Lipid Panel w/o Chol/HDL Ratio (Completed)   Urinalysis, Routine w reflex microscopic (Completed)   TSH (Completed)   Hepatitis C Antibody (Completed)   Globus sensation       Will check thyroid US. Await results. Treat as needed.    Relevant Orders   US THYROID   Need for immunization against influenza       Flu shot today   Relevant Orders  Flu Vaccine QUAD 6+ mos PF IM (Fluarix Quad PF) (Completed)        Follow up plan: Return 6-8 weeks, virtual OK.   LABORATORY TESTING:  - Pap smear: not applicable  IMMUNIZATIONS:   - Tdap: Tetanus vaccination status reviewed: last tetanus booster within 10 years. - Influenza: Administered today - Pneumovax: Not applicable - Prevnar: Not applicable - COVID: Given elsewhere - Shingrix vaccine: Refused  SCREENING: -Mammogram: Up to date  - Colonoscopy: Up to date   PATIENT COUNSELING:   Advised to take 1 mg of folate supplement per day if capable of pregnancy.   Sexuality: Discussed sexually transmitted diseases, partner selection, use of condoms, avoidance of unintended pregnancy  and contraceptive alternatives.   Advised to avoid cigarette smoking.  I discussed with  the patient that most people either abstain from alcohol or drink within safe limits (<=14/week and <=4 drinks/occasion for males, <=7/weeks and <= 3 drinks/occasion for females) and that the risk for alcohol disorders and other health effects rises proportionally with the number of drinks per week and how often a drinker exceeds daily limits.  Discussed cessation/primary prevention of drug use and availability of treatment for abuse.   Diet: Encouraged to adjust caloric intake to maintain  or achieve ideal body weight, to reduce intake of dietary saturated fat and total fat, to limit sodium intake by avoiding high sodium foods and not adding table salt, and to maintain adequate dietary potassium and calcium preferably from fresh fruits, vegetables, and low-fat dairy products.    stressed the importance of regular exercise  Injury prevention: Discussed safety belts, safety helmets, smoke detector, smoking near bedding or upholstery.   Dental health: Discussed importance of regular tooth brushing, flossing, and dental visits.    NEXT PREVENTATIVE PHYSICAL DUE IN 1 YEAR. Return 6-8 weeks, virtual OK.

## 2021-04-21 LAB — CBC WITH DIFFERENTIAL/PLATELET
Basophils Absolute: 0.1 10*3/uL (ref 0.0–0.2)
Basos: 1 %
EOS (ABSOLUTE): 0.2 10*3/uL (ref 0.0–0.4)
Eos: 2 %
Hematocrit: 45.2 % (ref 34.0–46.6)
Hemoglobin: 14.9 g/dL (ref 11.1–15.9)
Immature Grans (Abs): 0 10*3/uL (ref 0.0–0.1)
Immature Granulocytes: 0 %
Lymphocytes Absolute: 3.4 10*3/uL — ABNORMAL HIGH (ref 0.7–3.1)
Lymphs: 37 %
MCH: 30.9 pg (ref 26.6–33.0)
MCHC: 33 g/dL (ref 31.5–35.7)
MCV: 94 fL (ref 79–97)
Monocytes Absolute: 0.7 10*3/uL (ref 0.1–0.9)
Monocytes: 7 %
Neutrophils Absolute: 4.8 10*3/uL (ref 1.4–7.0)
Neutrophils: 53 %
Platelets: 254 10*3/uL (ref 150–450)
RBC: 4.82 x10E6/uL (ref 3.77–5.28)
RDW: 12.9 % (ref 11.7–15.4)
WBC: 9.2 10*3/uL (ref 3.4–10.8)

## 2021-04-21 LAB — COMPREHENSIVE METABOLIC PANEL
ALT: 24 IU/L (ref 0–32)
AST: 22 IU/L (ref 0–40)
Albumin/Globulin Ratio: 1.8 (ref 1.2–2.2)
Albumin: 4.7 g/dL (ref 3.8–4.9)
Alkaline Phosphatase: 100 IU/L (ref 44–121)
BUN/Creatinine Ratio: 14 (ref 9–23)
BUN: 14 mg/dL (ref 6–24)
Bilirubin Total: 0.3 mg/dL (ref 0.0–1.2)
CO2: 25 mmol/L (ref 20–29)
Calcium: 9.4 mg/dL (ref 8.7–10.2)
Chloride: 98 mmol/L (ref 96–106)
Creatinine, Ser: 0.97 mg/dL (ref 0.57–1.00)
Globulin, Total: 2.6 g/dL (ref 1.5–4.5)
Glucose: 87 mg/dL (ref 65–99)
Potassium: 4.5 mmol/L (ref 3.5–5.2)
Sodium: 139 mmol/L (ref 134–144)
Total Protein: 7.3 g/dL (ref 6.0–8.5)
eGFR: 71 mL/min/{1.73_m2} (ref >59)

## 2021-04-21 LAB — LIPID PANEL W/O CHOL/HDL RATIO
Cholesterol, Total: 233 mg/dL — ABNORMAL HIGH (ref 100–199)
HDL: 52 mg/dL (ref >39)
LDL Chol Calc (NIH): 156 mg/dL — ABNORMAL HIGH (ref 0–99)
Triglycerides: 138 mg/dL (ref 0–149)
VLDL Cholesterol Cal: 25 mg/dL (ref 5–40)

## 2021-04-21 LAB — TSH: TSH: 0.776 u[IU]/mL (ref 0.450–4.500)

## 2021-04-21 LAB — HEPATITIS C ANTIBODY: Hep C Virus Ab: <0.1 s/co ratio (ref 0.0–0.9)

## 2021-04-24 NOTE — Assessment & Plan Note (Signed)
Will start ozempic. Call with any concerns. Continue to monitor.

## 2021-04-24 NOTE — Assessment & Plan Note (Addendum)
Rechecking labs today. Await results. Treat as needed.  °

## 2021-04-24 NOTE — Assessment & Plan Note (Signed)
Under good control on current regimen. Continue current regimen. Continue to monitor. Call with any concerns. Refills given. Labs drawn today.   

## 2021-04-24 NOTE — Assessment & Plan Note (Signed)
Will check thyroid US. Await results. Treat as needed.

## 2021-04-24 NOTE — Assessment & Plan Note (Signed)
Rechecking labs today. Await results. Treat as needed.  °

## 2021-05-03 ENCOUNTER — Other Ambulatory Visit: Payer: Self-pay

## 2021-05-03 ENCOUNTER — Ambulatory Visit
Admission: RE | Admit: 2021-05-03 | Discharge: 2021-05-03 | Disposition: A | Discharge status: Home / Self Care | Payer: BC Managed Care – PPO | Source: Ambulatory Visit | Attending: Family Medicine | Admitting: Family Medicine

## 2021-05-03 DIAGNOSIS — R0989 Other specified symptoms and signs involving the circulatory and respiratory systems: Secondary | ICD-10-CM | POA: Diagnosis present

## 2021-05-03 DIAGNOSIS — E049 Nontoxic goiter, unspecified: Secondary | ICD-10-CM | POA: Insufficient documentation

## 2021-05-05 ENCOUNTER — Other Ambulatory Visit: Payer: Self-pay | Admitting: Family Medicine

## 2021-05-05 DIAGNOSIS — R1319 Other dysphagia: Secondary | ICD-10-CM

## 2021-05-05 DIAGNOSIS — E049 Nontoxic goiter, unspecified: Secondary | ICD-10-CM

## 2021-05-05 DIAGNOSIS — R0989 Other specified symptoms and signs involving the circulatory and respiratory systems: Secondary | ICD-10-CM

## 2021-05-24 ENCOUNTER — Encounter: Payer: Self-pay | Admitting: Family Medicine

## 2021-06-02 ENCOUNTER — Ambulatory Visit (INDEPENDENT_AMBULATORY_CARE_PROVIDER_SITE_OTHER): Payer: BC Managed Care – PPO | Admitting: Family Medicine

## 2021-06-02 ENCOUNTER — Encounter: Payer: Self-pay | Admitting: Family Medicine

## 2021-06-02 ENCOUNTER — Other Ambulatory Visit: Payer: Self-pay

## 2021-06-02 MED ORDER — WEGOVY 2.4 MG/0.75ML ~~LOC~~ SOAJ: 2.4000 mg | SUBCUTANEOUS | 6 refills | Status: DC

## 2021-06-02 NOTE — Assessment & Plan Note (Signed)
Doing well with another 5lbs down. 6lbs total. Continue wegovy. Continue slow, steady weight loss with goal of losing 1-2lbs per week. Continue to monitor. Call with any concerns.

## 2021-06-02 NOTE — Progress Notes (Signed)
BP 126/83   Pulse 80   Temp 98.4 F (36.9 C)   Wt 225 lb 6.4 oz (102.2 kg)   SpO2 96%   BMI 40.57 kg/m    Subjective:    Patient ID: Paula Clay, female    DOB: Oct 27, 1970, 50 y.o.   MRN: [id number]  HPI: Paula Clay is a 50 y.o. female  Chief Complaint  Patient presents with   Weight Check   OBESITY Duration: Chronic Previous attempts at weight loss: yes Complications of obesity: IFG, HLD, depression Peak weight: 231lbs Weight loss goal: to be healthy Weight loss to date: 6lbs  Requesting obesity pharmacotherapy: yes Current weight loss supplements/medications: yes Previous weight loss supplements/meds: yes  Relevant past medical, surgical, family and social history reviewed and updated as indicated. Interim medical history since our last visit reviewed. Allergies and medications reviewed and updated.  Review of Systems  Constitutional: Negative.   Respiratory: Negative.    Cardiovascular: Negative.   Gastrointestinal: Negative.   Musculoskeletal: Negative.   Neurological: Negative.   Psychiatric/Behavioral: Negative.     Per HPI unless specifically indicated above     Objective:    BP 126/83   Pulse 80   Temp 98.4 F (36.9 C)   Wt 225 lb 6.4 oz (102.2 kg)   SpO2 96%   BMI 40.57 kg/m   Wt Readings from Last 3 Encounters:  06/02/21 225 lb 6.4 oz (102.2 kg)  04/20/21 230 lb 12.8 oz (104.7 kg)  01/12/21 231 lb 12.8 oz (105.1 kg)    Physical Exam Vitals and nursing note reviewed.  Constitutional:      General: She is not in acute distress.    Appearance: Normal appearance. She is not ill-appearing, toxic-appearing or diaphoretic.  HENT:     Head: Normocephalic and atraumatic.     Right Ear: External ear normal.     Left Ear: External ear normal.     Nose: Nose normal.     Mouth/Throat:     Mouth: Mucous membranes are moist.     Pharynx: Oropharynx is clear.  Eyes:     General: No scleral icterus.       Right eye: No discharge.         Left eye: No discharge.     Extraocular Movements: Extraocular movements intact.     Conjunctiva/sclera: Conjunctivae normal.     Pupils: Pupils are equal, round, and reactive to light.  Cardiovascular:     Rate and Rhythm: Normal rate and regular rhythm.     Pulses: Normal pulses.     Heart sounds: Normal heart sounds. No murmur heard.   No friction rub. No gallop.  Pulmonary:     Effort: Pulmonary effort is normal. No respiratory distress.     Breath sounds: Normal breath sounds. No stridor. No wheezing, rhonchi or rales.  Chest:     Chest wall: No tenderness.  Musculoskeletal:        General: Normal range of motion.     Cervical back: Normal range of motion and neck supple.  Skin:    General: Skin is warm and dry.     Capillary Refill: Capillary refill takes less than 2 seconds.     Coloration: Skin is not jaundiced or pale.     Findings: No bruising, erythema, lesion or rash.  Neurological:     General: No focal deficit present.     Mental Status: She is alert and oriented to person, place, and time. Mental  status is at baseline.  Psychiatric:        Mood and Affect: Mood normal.        Behavior: Behavior normal.        Thought Content: Thought content normal.        Judgment: Judgment normal.    Results for orders placed or performed in visit on 04/20/21  CBC with Differential/Platelet  Result Value Ref Range   WBC 9.2 3.4 - 10.8 x10E3/uL   RBC 4.82 3.77 - 5.28 x10E6/uL   Hemoglobin 14.9 11.1 - 15.9 g/dL   Hematocrit 45.2 34.0 - 46.6 %   MCV 94 79 - 97 fL   MCH 30.9 26.6 - 33.0 pg   MCHC 33.0 31.5 - 35.7 g/dL   RDW 12.9 11.7 - 15.4 %   Platelets 254 150 - 450 x10E3/uL   Neutrophils 53 Not Estab. %   Lymphs 37 Not Estab. %   Monocytes 7 Not Estab. %   Eos 2 Not Estab. %   Basos 1 Not Estab. %   Neutrophils Absolute 4.8 1.4 - 7.0 x10E3/uL   Lymphocytes Absolute 3.4 (H) 0.7 - 3.1 x10E3/uL   Monocytes Absolute 0.7 0.1 - 0.9 x10E3/uL   EOS (ABSOLUTE) 0.2 0.0  - 0.4 x10E3/uL   Basophils Absolute 0.1 0.0 - 0.2 x10E3/uL   Immature Granulocytes 0 Not Estab. %   Immature Grans (Abs) 0.0 0.0 - 0.1 x10E3/uL  Comprehensive metabolic panel  Result Value Ref Range   Glucose 87 65 - 99 mg/dL   BUN 14 6 - 24 mg/dL   Creatinine, Ser 0.97 0.57 - 1.00 mg/dL   eGFR 71 >59 mL/min/1.73   BUN/Creatinine Ratio 14 9 - 23   Sodium 139 134 - 144 mmol/L   Potassium 4.5 3.5 - 5.2 mmol/L   Chloride 98 96 - 106 mmol/L   CO2 25 20 - 29 mmol/L   Calcium 9.4 8.7 - 10.2 mg/dL   Total Protein 7.3 6.0 - 8.5 g/dL   Albumin 4.7 3.8 - 4.9 g/dL   Globulin, Total 2.6 1.5 - 4.5 g/dL   Albumin/Globulin Ratio 1.8 1.2 - 2.2   Bilirubin Total 0.3 0.0 - 1.2 mg/dL   Alkaline Phosphatase 100 44 - 121 IU/L   AST 22 0 - 40 IU/L   ALT 24 0 - 32 IU/L  Lipid Panel w/o Chol/HDL Ratio  Result Value Ref Range   Cholesterol, Total 233 (H) 100 - 199 mg/dL   Triglycerides 138 0 - 149 mg/dL   HDL 52 >39 mg/dL   VLDL Cholesterol Cal 25 5 - 40 mg/dL   LDL Chol Calc (NIH) 156 (H) 0 - 99 mg/dL  Urinalysis, Routine w reflex microscopic  Result Value Ref Range   Specific Gravity, UA 1.010 1.005 - 1.030   pH, UA 6.0 5.0 - 7.5   Color, UA Yellow Yellow   Appearance Ur Clear Clear   Leukocytes,UA Negative Negative   Protein,UA Negative Negative/Trace   Glucose, UA Negative Negative   Ketones, UA Trace (A) Negative   RBC, UA Negative Negative   Bilirubin, UA Negative Negative   Urobilinogen, Ur 0.2 0.2 - 1.0 mg/dL   Nitrite, UA Negative Negative  TSH  Result Value Ref Range   TSH 0.776 0.450 - 4.500 uIU/mL  Hepatitis C Antibody  Result Value Ref Range   Hep C Virus Ab <0.1 0.0 - 0.9 s/co ratio  Bayer DCA Hb A1c Waived  Result Value Ref Range   HB A1C (BAYER DCA -  WAIVED) 5.6 4.8 - 5.6 %      Assessment & Plan:   Problem List Items Addressed This Visit       Other   Morbid obesity ([place]) - Primary    Doing well with another 5lbs down. 6lbs total. Continue wegovy. Continue slow,  steady weight loss with goal of losing 1-2lbs per week. Continue to monitor. Call with any concerns.       Relevant Medications   WEGOVY 2.4 MG/0.75ML SOAJ     Follow up plan: Return in about 6 months (around 11/30/2021).

## 2021-09-11 ENCOUNTER — Ambulatory Visit: Payer: BC Managed Care – PPO | Admitting: Family Medicine

## 2021-09-25 ENCOUNTER — Telehealth: Payer: Self-pay

## 2021-09-25 NOTE — Telephone Encounter (Signed)
PA initiated for [facility] 2.4MG /0.75ML Auto-Injector via CoverMyMeds Key: [id number] Waiting on response.

## 2021-09-25 NOTE — Telephone Encounter (Signed)
Pt called for status update, please advise. She says she is supposed to receive injection tomorrow

## 2021-09-27 NOTE — Telephone Encounter (Signed)
PA still denied by the patient's insurance. Routing to provider to advise on next steps.  ?

## 2021-09-27 NOTE — Telephone Encounter (Signed)
Pt called and wants to know how long she can go without receiving this injection. Pt has questions and does not understand this process, please advise. Wants to speak to clinic  ? ?Best contact: [phone] ?

## 2021-09-27 NOTE — Telephone Encounter (Signed)
Original PA was denied. Resubmitted PA as high priority with additional information.  ?

## 2021-09-29 NOTE — Telephone Encounter (Signed)
Unfortunately it has been denied by her insurance. I can review the paperwork to see what their reasoning is, but I think she will have to stay off of it until we can find out what's going on as it;s very expensive out of pocket. ?

## 2021-10-03 NOTE — Telephone Encounter (Signed)
We did the PA. It has been denied.  ?

## 2021-10-03 NOTE — Telephone Encounter (Signed)
Patient was notified of Dr.Johnson's recommendations.  ?

## 2021-10-03 NOTE — Telephone Encounter (Signed)
Spoke with patient and notified her of Dr.Johnson's recommendations. Patient states she was notified that Dr.Johnson had submitted some paperwork on behalf of the patient in regards to the medications. Please advise? ?

## 2021-11-16 ENCOUNTER — Encounter: Payer: Self-pay | Admitting: Family Medicine

## 2021-11-20 ENCOUNTER — Ambulatory Visit (INDEPENDENT_AMBULATORY_CARE_PROVIDER_SITE_OTHER): Payer: BC Managed Care – PPO | Admitting: Family Medicine

## 2021-11-20 ENCOUNTER — Encounter: Payer: Self-pay | Admitting: Family Medicine

## 2021-11-20 MED ORDER — SEMAGLUTIDE-WEIGHT MANAGEMENT 1.7 MG/0.75ML ~~LOC~~ SOAJ: 1.7000 mg | SUBCUTANEOUS | 0 refills | Status: AC

## 2021-11-20 MED ORDER — SEMAGLUTIDE-WEIGHT MANAGEMENT 0.5 MG/0.5ML ~~LOC~~ SOAJ: 0.5000 mg | SUBCUTANEOUS | 0 refills | Status: AC

## 2021-11-20 MED ORDER — SEMAGLUTIDE-WEIGHT MANAGEMENT 0.25 MG/0.5ML ~~LOC~~ SOAJ: 0.2500 mg | SUBCUTANEOUS | 0 refills | Status: AC

## 2021-11-20 MED ORDER — SEMAGLUTIDE-WEIGHT MANAGEMENT 2.4 MG/0.75ML ~~LOC~~ SOAJ: 2.4000 mg | SUBCUTANEOUS | 1 refills | Status: AC

## 2021-11-20 MED ORDER — SEMAGLUTIDE-WEIGHT MANAGEMENT 1 MG/0.5ML ~~LOC~~ SOAJ: 1.0000 mg | SUBCUTANEOUS | 0 refills | Status: AC

## 2021-11-20 NOTE — Progress Notes (Signed)
? ?BP 106/70   Pulse 87   Temp 98.6 ?F (37 ?C)   Wt 224 lb 12.8 oz (102 kg)   SpO2 97%   BMI 40.46 kg/m?   ? ?Subjective:  ? ? Patient ID: Paula Clay, female    DOB: 18-Apr-1971, 51 y.o.   MRN: [id number] ? ?HPI: ?Paula Clay is a 51 y.o. female ? ?Chief Complaint  ?Patient presents with  ? Obesity  ?  Patient states she was on wegovy for about 3 months and then was denied by her insurance so she had to stop   ? ?OBESITY ?Duration: chronic ?Previous attempts at weight loss: yes ?Complications of obesity: IFG, HLD, depression ?Peak weight: 231 lbs ?Weight loss goal: to be healthy ?Weight loss to date: had been down 17lbs on wegovy, then gained back off- now down 7lbs ?Requesting obesity pharmacotherapy: yes ?Current weight loss supplements/medications: no ?Previous weight loss supplements/meds: yes ? ?Relevant past medical, surgical, family and social history reviewed and updated as indicated. Interim medical history since our last visit reviewed. ?Allergies and medications reviewed and updated. ? ?Review of Systems  ?Constitutional: Negative.   ?Respiratory: Negative.    ?Cardiovascular: Negative.   ?Musculoskeletal: Negative.   ?Neurological: Negative.   ?Psychiatric/Behavioral: Negative.    ? ?Per HPI unless specifically indicated above ? ?   ?Objective:  ?  ?BP 106/70   Pulse 87   Temp 98.6 ?F (37 ?C)   Wt 224 lb 12.8 oz (102 kg)   SpO2 97%   BMI 40.46 kg/m?   ?Wt Readings from Last 3 Encounters:  ?11/20/21 224 lb 12.8 oz (102 kg)  ?06/02/21 225 lb 6.4 oz (102.2 kg)  ?04/20/21 230 lb 12.8 oz (104.7 kg)  ?  ?Physical Exam ?Vitals and nursing note reviewed.  ?Constitutional:   ?   General: She is not in acute distress. ?   Appearance: Normal appearance. She is obese. She is not ill-appearing, toxic-appearing or diaphoretic.  ?HENT:  ?   Head: Normocephalic and atraumatic.  ?   Right Ear: External ear normal.  ?   Left Ear: External ear normal.  ?   Nose: Nose normal.  ?   Mouth/Throat:  ?    Mouth: Mucous membranes are moist.  ?   Pharynx: Oropharynx is clear.  ?Eyes:  ?   General: No scleral icterus.    ?   Right eye: No discharge.     ?   Left eye: No discharge.  ?   Extraocular Movements: Extraocular movements intact.  ?   Conjunctiva/sclera: Conjunctivae normal.  ?   Pupils: Pupils are equal, round, and reactive to light.  ?Cardiovascular:  ?   Rate and Rhythm: Normal rate and regular rhythm.  ?   Pulses: Normal pulses.  ?   Heart sounds: Normal heart sounds. No murmur heard. ?  No friction rub. No gallop.  ?Pulmonary:  ?   Effort: Pulmonary effort is normal. No respiratory distress.  ?   Breath sounds: Normal breath sounds. No stridor. No wheezing, rhonchi or rales.  ?Chest:  ?   Chest wall: No tenderness.  ?Musculoskeletal:     ?   General: Normal range of motion.  ?   Cervical back: Normal range of motion and neck supple.  ?Skin: ?   General: Skin is warm and dry.  ?   Capillary Refill: Capillary refill takes less than 2 seconds.  ?   Coloration: Skin is not jaundiced or pale.  ?  Findings: No bruising, erythema, lesion or rash.  ?Neurological:  ?   General: No focal deficit present.  ?   Mental Status: She is alert and oriented to person, place, and time. Mental status is at baseline.  ?Psychiatric:     ?   Mood and Affect: Mood normal.     ?   Behavior: Behavior normal.     ?   Thought Content: Thought content normal.     ?   Judgment: Judgment normal.  ? ? ?Results for orders placed or performed in visit on 04/20/21  ?CBC with Differential/Platelet  ?Result Value Ref Range  ? WBC 9.2 3.4 - 10.8 x10E3/uL  ? RBC 4.82 3.77 - 5.28 x10E6/uL  ? Hemoglobin 14.9 11.1 - 15.9 g/dL  ? Hematocrit 45.2 34.0 - 46.6 %  ? MCV 94 79 - 97 fL  ? MCH 30.9 26.6 - 33.0 pg  ? MCHC 33.0 31.5 - 35.7 g/dL  ? RDW 12.9 11.7 - 15.4 %  ? Platelets 254 150 - 450 x10E3/uL  ? Neutrophils 53 Not Estab. %  ? Lymphs 37 Not Estab. %  ? Monocytes 7 Not Estab. %  ? Eos 2 Not Estab. %  ? Basos 1 Not Estab. %  ? Neutrophils Absolute  4.8 1.4 - 7.0 x10E3/uL  ? Lymphocytes Absolute 3.4 (H) 0.7 - 3.1 x10E3/uL  ? Monocytes Absolute 0.7 0.1 - 0.9 x10E3/uL  ? EOS (ABSOLUTE) 0.2 0.0 - 0.4 x10E3/uL  ? Basophils Absolute 0.1 0.0 - 0.2 x10E3/uL  ? Immature Granulocytes 0 Not Estab. %  ? Immature Grans (Abs) 0.0 0.0 - 0.1 x10E3/uL  ?Comprehensive metabolic panel  ?Result Value Ref Range  ? Glucose 87 65 - 99 mg/dL  ? BUN 14 6 - 24 mg/dL  ? Creatinine, Ser 0.97 0.57 - 1.00 mg/dL  ? eGFR 71 >59 mL/min/1.73  ? BUN/Creatinine Ratio 14 9 - 23  ? Sodium 139 134 - 144 mmol/L  ? Potassium 4.5 3.5 - 5.2 mmol/L  ? Chloride 98 96 - 106 mmol/L  ? CO2 25 20 - 29 mmol/L  ? Calcium 9.4 8.7 - 10.2 mg/dL  ? Total Protein 7.3 6.0 - 8.5 g/dL  ? Albumin 4.7 3.8 - 4.9 g/dL  ? Globulin, Total 2.6 1.5 - 4.5 g/dL  ? Albumin/Globulin Ratio 1.8 1.2 - 2.2  ? Bilirubin Total 0.3 0.0 - 1.2 mg/dL  ? Alkaline Phosphatase 100 44 - 121 IU/L  ? AST 22 0 - 40 IU/L  ? ALT 24 0 - 32 IU/L  ?Lipid Panel w/o Chol/HDL Ratio  ?Result Value Ref Range  ? Cholesterol, Total 233 (H) 100 - 199 mg/dL  ? Triglycerides 138 0 - 149 mg/dL  ? HDL 52 >39 mg/dL  ? VLDL Cholesterol Cal 25 5 - 40 mg/dL  ? LDL Chol Calc (NIH) 156 (H) 0 - 99 mg/dL  ?Urinalysis, Routine w reflex microscopic  ?Result Value Ref Range  ? Specific Gravity, UA 1.010 1.005 - 1.030  ? pH, UA 6.0 5.0 - 7.5  ? Color, UA Yellow Yellow  ? Appearance Ur Clear Clear  ? Leukocytes,UA Negative Negative  ? Protein,UA Negative Negative/Trace  ? Glucose, UA Negative Negative  ? Ketones, UA Trace (A) Negative  ? RBC, UA Negative Negative  ? Bilirubin, UA Negative Negative  ? Urobilinogen, Ur 0.2 0.2 - 1.0 mg/dL  ? Nitrite, UA Negative Negative  ?TSH  ?Result Value Ref Range  ? TSH 0.776 0.450 - 4.500 uIU/mL  ?Hepatitis C  Antibody  ?Result Value Ref Range  ? Hep C Virus Ab <0.1 0.0 - 0.9 s/co ratio  ?Bayer DCA Hb A1c Waived  ?Result Value Ref Range  ? HB A1C (BAYER DCA - WAIVED) 5.6 4.8 - 5.6 %  ? ?   ?Assessment & Plan:  ? ?Problem List Items  Addressed This Visit   ? ?  ? Other  ? Morbid obesity ([place]) - Primary  ?  Will try to get wegovy approved again. Restart. Recheck 6 weeks. Call with any concerns.  ? ?  ?  ? Relevant Medications  ? Semaglutide-Weight Management 0.25 MG/0.5ML SOAJ  ? Semaglutide-Weight Management 0.5 MG/0.5ML SOAJ (Start on 12/19/2021)  ? Semaglutide-Weight Management 1 MG/0.5ML SOAJ (Start on 01/17/2022)  ? Semaglutide-Weight Management 1.7 MG/0.75ML SOAJ (Start on 02/15/2022)  ? Semaglutide-Weight Management 2.4 MG/0.75ML SOAJ (Start on 03/16/2022)  ?  ? ?Follow up plan: ?Return in about 6 weeks (around 01/01/2022). ? ? ? ? ? ?

## 2021-11-20 NOTE — Assessment & Plan Note (Signed)
Will try to get wegovy approved again. Restart. Recheck 6 weeks. Call with any concerns.  ?

## 2021-11-22 ENCOUNTER — Telehealth: Payer: Self-pay

## 2021-11-22 NOTE — Telephone Encounter (Signed)
Initiated PA via CoverMyMeds for [employer] 0.'25MG'$   ?Key : [id number] ?Waiting on determination  ?

## 2021-11-24 NOTE — Telephone Encounter (Signed)
PA denied for [facility], will appeal per Dr. Wynetta Emery send over letter to appeal.  ?

## 2021-12-01 ENCOUNTER — Ambulatory Visit: Payer: BC Managed Care – PPO | Admitting: Family Medicine

## 2021-12-04 ENCOUNTER — Encounter: Payer: Self-pay | Admitting: Family Medicine

## 2021-12-04 NOTE — Telephone Encounter (Signed)
Waiting on appeal form [employer].  ?

## 2021-12-08 ENCOUNTER — Ambulatory Visit: Payer: BC Managed Care – PPO | Admitting: Family Medicine

## 2021-12-19 ENCOUNTER — Ambulatory Visit: Payer: Self-pay

## 2021-12-19 ENCOUNTER — Encounter: Payer: Self-pay | Admitting: Family Medicine

## 2021-12-19 NOTE — Telephone Encounter (Signed)
Summary: Medicatio b/o / clarification   Pharmacy stated that the medication Semaglutide-Weight Management is currently back ordered in 1 mg, 0.5 mg, and 0.25 mg.  However, they are wanting to clarify if the Rx is correct because Pt has been on 2.4 mg and is unsure if it's supposed to be this way .   Pharmacy requesting a call back for clarification

## 2021-12-19 NOTE — Telephone Encounter (Signed)
    Pharmacy reports the only dose currently available is 2.4 mg. All lower doses are on back order - 1 mg, 0.5 mg, 0.25.mg. Pharmacy states pt. Has been on 2.4 mg in the past. Does PCP want pt. On this dose? Please advise pharmacy. Answer Assessment - Initial Assessment Questions 1. NAME of MEDICATION: "What medicine are you calling about?"     Wegovvy  2. QUESTION: "What is your question?" (e.g., double dose of medicine, side effect)     Prescribed dose 3. PRESCRIBING HCP: "Who prescribed it?" Reason: if prescribed by specialist, call should be referred to that group.     Dr. Wynetta Emery 4. SYMPTOMS: "Do you have any symptoms?"     N/a 5. SEVERITY: If symptoms are present, ask "Are they mild, moderate or severe?"     N/a 6. PREGNANCY:  "Is there any chance that you are pregnant?" "When was your last menstrual period?"     No  Protocols used: Medication Question Call-A-AH

## 2021-12-19 NOTE — Telephone Encounter (Signed)
Currently waiting on determination from insurance for [facility] appeal. If approved will notify provider to determine what dosage patient should start back on.

## 2022-01-03 NOTE — Telephone Encounter (Signed)
Paula Clay appeal has been denied, notified patient via [place].

## 2022-01-08 ENCOUNTER — Ambulatory Visit: Payer: BC Managed Care – PPO | Admitting: Family Medicine

## 2022-01-18 ENCOUNTER — Other Ambulatory Visit: Payer: Self-pay | Admitting: Family Medicine

## 2022-01-18 NOTE — Telephone Encounter (Signed)
Requested Prescriptions  Pending Prescriptions Disp Refills  . escitalopram (LEXAPRO) 20 MG tablet [Pharmacy Med Name: ESCITALOPRAM OXALATE 20 MG TAB] 90 tablet 1    Sig: TAKE 1 TABLET BY MOUTH ONCE DAILY     Psychiatry:  Antidepressants - SSRI Passed - 01/18/2022 11:47 AM      Passed - Completed PHQ-2 or PHQ-9 in the last 360 days      Passed - Valid encounter within last 6 months    Recent Outpatient Visits          1 month ago Morbid obesity ([place])   [place], [name], DO   7 months ago Morbid obesity ([place])   [place], [name], DO   9 months ago Routine general medical examination at a health care facility   [facility], [place], DO   1 year ago 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)   [place], [name], DO   1 year ago Routine general medical examination at a health care facility   [facility], [place], DO

## 2022-04-27 ENCOUNTER — Other Ambulatory Visit: Payer: Self-pay | Admitting: Family Medicine

## 2022-04-27 DIAGNOSIS — Z1231 Encounter for screening mammogram for malignant neoplasm of breast: Secondary | ICD-10-CM

## 2022-05-01 ENCOUNTER — Other Ambulatory Visit: Payer: Self-pay | Admitting: Family Medicine

## 2022-05-01 NOTE — Telephone Encounter (Signed)
Please call patient and schedule physical with PCP for refills.

## 2022-05-01 NOTE — Telephone Encounter (Signed)
Requested medications are due for refill today.  yes  Requested medications are on the active medications list.  yes  Last refill. 04/20/2021 #360 1 rf  Future visit scheduled.   no  Notes to clinic.  Labs are expired. Sig needs an update.    Requested Prescriptions  Pending Prescriptions Disp Refills   buPROPion (WELLBUTRIN SR) 150 MG 12 hr tablet [Pharmacy Med Name: BUPROPION HCL ER (SR) 150 MG TAB] 360 tablet 1    Sig: TAKE 2 TABLETS BY MOUTH ONCE EVERY MORNING AND 1 TAB ONCE EVERY EVENING FOR1 WEEK THEN TAKE 2 TABLETS BY MOUTH TWICE DAILY     Psychiatry: Antidepressants - bupropion Failed - 05/01/2022  9:53 AM      Failed - Cr in normal range and within 360 days    Creatinine, Ser  Date Value Ref Range Status  04/20/2021 0.97 0.57 - 1.00 mg/dL Final         Failed - AST in normal range and within 360 days    AST  Date Value Ref Range Status  04/20/2021 22 0 - 40 IU/L Final         Failed - ALT in normal range and within 360 days    ALT  Date Value Ref Range Status  04/20/2021 24 0 - 32 IU/L Final         Passed - Completed PHQ-2 or PHQ-9 in the last 360 days      Passed - Last BP in normal range    BP Readings from Last 1 Encounters:  11/20/21 106/70         Passed - Valid encounter within last 6 months    Recent Outpatient Visits           5 months ago Morbid obesity ([place])   [place], [name], DO   11 months ago Morbid obesity ([place])   [place], [place] P, DO   1 year ago Routine general medical examination at a health care facility   [facility], [place], DO   1 year ago 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

## 2022-05-02 NOTE — Telephone Encounter (Signed)
Called patient to schedule her physical. Pt states she is in class right now and will have to call back. Please schedule pt for a physical asap

## 2022-05-10 NOTE — Telephone Encounter (Signed)
Sent mychart message asking patient to call to schedule an appointment

## 2022-05-23 NOTE — Telephone Encounter (Signed)
Appt please

## 2022-05-25 ENCOUNTER — Ambulatory Visit
Admission: RE | Admit: 2022-05-25 | Discharge: 2022-05-25 | Disposition: A | Discharge status: Home / Self Care | Payer: BC Managed Care – PPO | Source: Ambulatory Visit | Attending: Family Medicine | Admitting: Family Medicine

## 2022-05-25 DIAGNOSIS — Z1231 Encounter for screening mammogram for malignant neoplasm of breast: Secondary | ICD-10-CM | POA: Diagnosis present

## 2022-07-13 ENCOUNTER — Other Ambulatory Visit: Payer: Self-pay | Admitting: Family Medicine

## 2022-07-13 NOTE — Telephone Encounter (Signed)
Requested Prescriptions  Pending Prescriptions Disp Refills   busPIRone (BUSPAR) 15 MG tablet [Pharmacy Med Name: BUSPIRONE HCL 15 MG TAB] 90 tablet 0    Sig: TAKE 1 TABLET BY MOUTH ONCE DAILY     Psychiatry: Anxiolytics/Hypnotics - Non-controlled Passed - 07/13/2022 10:36 AM      Passed - Valid encounter within last 12 months    Recent Outpatient Visits           7 months ago Morbid obesity ([place])   [place], [name], DO   1 year ago Morbid obesity ([place])   [place], [name], DO   1 year ago Routine general medical examination at a health care facility   [facility], [place], DO   1 year ago 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

## 2022-10-22 ENCOUNTER — Other Ambulatory Visit: Payer: Self-pay | Admitting: Family Medicine

## 2022-10-23 NOTE — Telephone Encounter (Signed)
Requested Prescriptions  Pending Prescriptions Disp Refills   busPIRone (BUSPAR) 15 MG tablet [Pharmacy Med Name: BUSPIRONE HCL 15 MG TAB] 90 tablet 0    Sig: TAKE 1 TABLET BY MOUTH ONCE DAILY     Psychiatry: Anxiolytics/Hypnotics - Non-controlled Passed - 10/22/2022 10:30 AM      Passed - Valid encounter within last 12 months    Recent Outpatient Visits           11 months ago Morbid obesity ([place])   [place], [place] P, DO   1 year ago Morbid obesity [facility])   [place], [name], DO   1 year ago Routine general medical examination at a health care facility   [facility], [place] P, DO   1 year ago Mild episode of recurrent major depressive disorder [facility])   [place], [name], DO   2 years ago Routine general medical examination at a health care facility   [facility], [place], DO

## 2022-12-31 ENCOUNTER — Other Ambulatory Visit (HOSPITAL_COMMUNITY): Payer: Self-pay | Admitting: General Surgery

## 2022-12-31 DIAGNOSIS — E669 Obesity, unspecified: Secondary | ICD-10-CM

## 2023-01-22 ENCOUNTER — Ambulatory Visit (HOSPITAL_COMMUNITY)
Admission: RE | Admit: 2023-01-22 | Discharge: 2023-01-22 | Disposition: A | Discharge status: Home / Self Care | Payer: BC Managed Care – PPO | Source: Ambulatory Visit | Attending: General Surgery | Admitting: General Surgery

## 2023-01-22 ENCOUNTER — Encounter (HOSPITAL_COMMUNITY)
Admission: RE | Admit: 2023-01-22 | Discharge: 2023-01-22 | Disposition: A | Discharge status: Home / Self Care | Payer: BC Managed Care – PPO | Source: Ambulatory Visit | Attending: General Surgery | Admitting: General Surgery

## 2023-01-22 ENCOUNTER — Other Ambulatory Visit (HOSPITAL_COMMUNITY): Payer: Self-pay | Admitting: General Surgery

## 2023-01-22 DIAGNOSIS — Z01818 Encounter for other preprocedural examination: Secondary | ICD-10-CM | POA: Insufficient documentation

## 2023-01-22 DIAGNOSIS — E669 Obesity, unspecified: Secondary | ICD-10-CM

## 2023-01-24 ENCOUNTER — Encounter: Payer: Self-pay | Admitting: Dietician

## 2023-01-24 ENCOUNTER — Encounter: Payer: BC Managed Care – PPO | Attending: General Surgery | Admitting: Dietician

## 2023-01-24 VITALS — Ht 62.0 in | Wt 215.3 lb

## 2023-01-24 DIAGNOSIS — E669 Obesity, unspecified: Secondary | ICD-10-CM | POA: Insufficient documentation

## 2023-01-24 NOTE — Progress Notes (Signed)
Nutrition Assessment for Bariatric Surgery: Pre-Surgery Behavioral and Nutrition Intervention Program   Medical Nutrition Therapy  Appt Start Time: 2:00    End Time: 2:56  Patient was seen on 01/24/2023 for Pre-Operative Nutrition Assessment. Purpose of todays visit  enhance perioperative outcomes along with a healthy weight maintenance   Referral stated Supervised Weight Loss (SWL) visits needed: 0  Pt completed visits.   Pt has cleared nutrition requirements.   Pt has completed visits. No further supervised visits required/recommended.  Planned surgery: Sleeve Gastrectomy Pt expectation of surgery: not to be obese; A1c and cholesterol to go down and more energy.   NUTRITION ASSESSMENT   Anthropometrics  Start weight at [facility]: 215.3 lbs (date: 01/24/2023)  Height: 62 in BMI: 39.38 kg/m2     Clinical   Pharmacotherapy: History of weight loss medication used: several different injectables  Medical hx: hypercholesterolemia, obesity Medications: magnesium, calcium, B complex, fish oil, Vit C, Vit D, rosuvastatin, bupropion, lexapro  Labs: A1c 5.7, chol 261, triglycerides 175, LDL 173, glucose 100 Notable signs/symptoms: none noted Any previous deficiencies? No  Evaluation of Nutritional Deficiencies: Micronutrient Nutrition Focused Physical Exam: Hair: No issues observed Eyes: No issues observed Mouth: No issues observed Neck: No issues observed Nails: No issues observed Skin: No issues observed  Lifestyle & Dietary Hx  Pt states she has issues with constipation now. Dietitian recommended Miralax.  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       Stress Management Techniques: read; exercise  According to the Dietary Guidelines for Americans Recommendation: equivalent 1.5-2 cups fruits per day, equivalent 2-3 cups vegetables per day and at least half all grains whole  Fruit servings per day (on average): 0-1, meeting 0-33% recommendation  Non-starchy vegetable servings per day (on average): 1-2, meeting 33-50% recommendation  Whole Grains per day (on average): 1  Number of meals missed/skipped per week out of 21: 1  24-Hr Dietary Recall First Meal: raisin bran with oat milk or two good yogurt Snack:  Second Meal: peanut butter and banana sandwich on whole wheat bread or side salad and cottage cheese Snack:  Third Meal: meat and two vegetables or large salad Snack: cheddar cheese popcorn or dill pickle or 5 ritz cracker with peanut butter Beverages: coffee, water, half and half tea, or zero sugar soda.  Alcoholic beverages per week: 0-1 (maybe once a month   Estimated Energy Needs Calories: 1500   NUTRITION DIAGNOSIS  Overweight/obesity ([place]-3.3) related to past poor dietary habits and physical inactivity as evidenced by patient w/ planned sleeve surgery following dietary guidelines for continued weight loss.    NUTRITION INTERVENTION  Nutrition counseling (C-1) and education (E-2) to facilitate bariatric surgery goals.  Educated pt on micronutrient deficiencies post-surgery and behavioral/dietary strategies to start in order to mitigate that risk   Behavioral and Dietary Interventions Pre-Op Goals Reviewed with the Patient Nutrition: Healthy Eating Behaviors Switch to non-caloric, non-carbonated and non-caffeinated beverages such as  water, unsweetened tea, Crystal Light and zero calorie beverages (aim for 64 oz. per day) Cut out grazing between meals or  at night  Find a protein shake you like Eat  every 3-5 hours        Eliminate distractions while eating (TV, computer, reading, driving, texting) Take 16-10 minutes to eat a meal  Decrease high sugar foods/decrease high fat/fried foods Eliminate alcoholic beverages Increase protein intake (eggs, fish, chicken, yogurt) before surgery Eat non starchy vegetables 2 times a day 7 days a week Eat complex carbohydrates such as whole grains and fruits   Behavioral Modification: Physical Activity Increase my usual daily activity (use stairs, park farther, etc.) Engage in _______________________  activity  _______ minutes ______ times per week  Other:    _________________________________________________________________     Problem Solving I will think about my usual eating patterns and how to tweak them How can my friends and family support me Barriers to starting my changes Learn and understand appetite verses hunger   Healthy Coping Allow for ___________ activities per week to help me manage stress Reframe negative thoughts I will keep a picture of someone or something that is my inspiration & look at it daily   Monitoring  Weigh myself once a week  Measure my progress by monitoring how my clothes fit Keep a food record of what I eat and drink for the next ________ (time period) Take pictures of what I eat and drink for the next ________ (time period) Use an app to count steps/day for the next_______ (time period) Measure my progress such as increased energy and more restful sleep Monitor your acid reflux and bowel habits, are they getting better?   *Goals that are bolded indicate the pt would like to start working towards these  Handouts Provided Include  Bariatric Surgery handouts (Nutrition Visits, Pre Surgery Behavioral Change Goals, Protein Shakes Brands to Choose From, Vitamins & Mineral Supplementation)  Learning Style & Readiness for Change Teaching method utilized: Visual, Auditory, and hands on  Demonstrated degree of  understanding via: Teach Back  Readiness Level: preparation Barriers to learning/adherence to lifestyle change: nothing identified  RD's Notes for Next Visit     MONITORING & EVALUATION Dietary intake, weekly physical activity, body weight, and preoperative behavioral change goals   Next Steps  Pt has completed visits. No further supervised visits required/recommended. Patient is to follow up at [facility] for pre-op class >2 weeks prior to scheduled surgery.

## 2023-02-18 ENCOUNTER — Encounter: Payer: Self-pay | Admitting: Skilled Nursing Facility1

## 2023-02-18 ENCOUNTER — Ambulatory Visit: Payer: Self-pay | Admitting: General Surgery

## 2023-02-18 ENCOUNTER — Encounter: Payer: BC Managed Care – PPO | Attending: General Surgery | Admitting: Skilled Nursing Facility1

## 2023-02-18 NOTE — Progress Notes (Signed)
Pre-Operative Nutrition Class:    Patient was seen on 02/18/2023 for Pre-Operative Bariatric Surgery Education at [facility]    Surgery date: 03/05/2023 Surgery type: Sleeve Start weight at [facility]: 215.3 Weight today: 215.8    The following the learning objectives were met by the patient during this course: Identify Pre-Op Dietary Goals and will begin 2 weeks pre-operatively Identify appropriate sources of fluids and proteins  State protein recommendations and appropriate sources pre and post-operatively Identify Post-Operative Dietary Goals and will follow for 2 weeks post-operatively Identify appropriate multivitamin and calcium sources Describe the need for physical activity post-operatively and will follow MD recommendations State when to call healthcare provider regarding medication questions or post-operative complications When having a diagnosis of diabetes understanding hypoglycemia symptoms and the inclusion of 1 complex carbohydrate per meal  Handouts given during class include: Pre-Op Bariatric Surgery Diet Handout Protein Shake Handout Post-Op Bariatric Surgery Nutrition Handout BELT Program Information Flyer Support Group Information Flyer [facility] Bariatric Supplements Price List  Follow-Up Plan: Patient will follow-up at [facility] 2 weeks post operatively for diet advancement per MD.

## 2023-02-18 NOTE — Progress Notes (Signed)
Sent message, via epic in basket, requesting orders in epic from surgeon.  

## 2023-02-21 NOTE — Patient Instructions (Signed)
DUE TO COVID-19 ONLY TWO VISITORS  (aged 53 and older)  ARE ALLOWED TO COME WITH YOU AND STAY IN THE WAITING ROOM ONLY DURING PRE OP AND PROCEDURE.   **NO VISITORS ARE ALLOWED IN THE SHORT STAY AREA OR RECOVERY ROOM!!**  IF YOU WILL BE ADMITTED INTO THE HOSPITAL YOU ARE ALLOWED ONLY FOUR SUPPORT PEOPLE DURING VISITATION HOURS ONLY (7 AM -8PM)   The support person(s) must pass our screening, gel in and out, and wear a mask at all times, including in the patient's room. Patients must also wear a mask when staff or their support person are in the room. Visitors GUEST BADGE MUST BE WORN VISIBLY  One adult visitor may remain with you overnight and MUST be in the room by 8 P.M.     Your procedure is scheduled on: 03/05/23   Report to [facility] Main Entrance    Report to admitting at : 9:15 AM   Call this number if you have problems the morning of surgery [phone]   MORNING OF SURGERY DRINK:   DRINK 1 G2 drink BEFORE YOU LEAVE HOME, DRINK ALL OF THE  G2 DRINK AT ONE TIME.   NO SOLID FOOD AFTER 600 PM THE NIGHT BEFORE YOUR SURGERY. YOU MAY DRINK CLEAR FLUIDS. THE G2 DRINK YOU DRINK BEFORE YOU LEAVE HOME WILL BE THE LAST FLUIDS YOU DRINK BEFORE SURGERY.  PAIN IS EXPECTED AFTER SURGERY AND WILL NOT BE COMPLETELY ELIMINATED. AMBULATION AND TYLENOL WILL HELP REDUCE INCISIONAL AND GAS PAIN. MOVEMENT IS KEY!  YOU ARE EXPECTED TO BE OUT OF BED WITHIN 4 HOURS OF ADMISSION TO YOUR PATIENT ROOM.  SITTING IN THE RECLINER THROUGHOUT THE DAY IS IMPORTANT FOR DRINKING FLUIDS AND MOVING GAS THROUGHOUT THE GI TRACT.  COMPRESSION STOCKINGS SHOULD BE WORN [facility] STAY UNLESS YOU ARE WALKING.   INCENTIVE SPIROMETER SHOULD BE USED EVERY HOUR WHILE AWAKE TO DECREASE POST-OPERATIVE COMPLICATIONS SUCH AS PNEUMONIA.  WHEN DISCHARGED HOME, IT IS IMPORTANT TO CONTINUE TO WALK EVERY HOUR AND USE THE INCENTIVE SPIROMETER EVERY HOUR.   Clear liquids from 6:00 PM the day before surgery until  : 6:30 AM DAY OF SURGERY  Water Black Coffee (sugar ok, NO MILK/CREAM OR CREAMERS)  Tea (sugar ok, NO MILK/CREAM OR CREAMERS) regular and decaf                             Plain Jell-O (NO RED)                                           Fruit ices (not with fruit pulp, NO RED)                                     Popsicles (NO RED)                                                                  Juice: apple, WHITE grape, WHITE cranberry Sports drinks like Gatorade (NO RED)   The day of surgery:  Drink ONE (  1) Pre-Surgery Clear G2 at : 6:30 AM the morning of surgery. Drink in one sitting. Do not sip.  This drink was given to you during your hospital  pre-op appointment visit. Nothing else to drink after completing the  Pre-Surgery Clear Ensure or G2.          If you have questions, please contact your surgeon's office.   Oral Hygiene is also important to reduce your risk of infection.                                    Remember - BRUSH YOUR TEETH THE MORNING OF SURGERY WITH YOUR REGULAR TOOTHPASTE  DENTURES WILL BE REMOVED PRIOR TO SURGERY PLEASE DO NOT APPLY "Poly grip" OR ADHESIVES!!!   Do NOT smoke after Midnight   Take these medicines the morning of surgery with A SIP OF WATER: buspirone,bupropion,escitalopram.  DO NOT TAKE ANY ORAL DIABETIC MEDICATIONS DAY OF YOUR SURGERY  Bring CPAP mask and tubing day of surgery.                              You may not have any metal on your body including hair pins, jewelry, and body piercing             Do not wear make-up, lotions, powders, perfumes/cologne, or deodorant  Do not wear nail polish including gel and S&S, artificial/acrylic nails, or any other type of covering on natural nails including finger and toenails. If you have artificial nails, gel coating, etc. that needs to be removed by a nail salon please have this removed prior to surgery or surgery may need to be canceled/ delayed if the surgeon/ anesthesia feels like they  are unable to be safely monitored.   Do not shave  48 hours prior to surgery.    Do not bring valuables to the hospital.  IS NOT             RESPONSIBLE   FOR VALUABLES.   Contacts, glasses, or bridgework may not be worn into surgery.   Bring small overnight bag day of surgery.   DO NOT BRING YOUR HOME MEDICATIONS TO THE HOSPITAL. PHARMACY WILL DISPENSE MEDICATIONS LISTED ON YOUR MEDICATION LIST TO YOU DURING YOUR ADMISSION IN THE HOSPITAL!    Patients discharged on the day of surgery will not be allowed to drive home.  Someone NEEDS to stay with you for the first 24 hours after anesthesia.   Special Instructions: Bring a copy of your healthcare power of attorney and living will documents         the day of surgery if you haven't scanned them before.              Please read over the following fact sheets you were given: IF YOU HAVE QUESTIONS ABOUT YOUR PRE-OP INSTRUCTIONS PLEASE CALL [phone]    [facility] - Preparing for Surgery Before surgery, you can play an important role.  Because skin is not sterile, your skin needs to be as free of germs as possible.  You can reduce the number of germs on your skin by washing with CHG (chlorahexidine gluconate) soap before surgery.  CHG is an antiseptic cleaner which kills germs and bonds with the skin to continue killing germs even after washing. Please DO NOT use if you have an allergy to CHG or antibacterial  soaps.  If your skin becomes reddened/irritated stop using the CHG and inform your nurse when you arrive at Short Stay. Do not shave (including legs and underarms) for at least 48 hours prior to the first CHG shower.  You may shave your face/neck. Please follow these instructions carefully:  1.  Shower with CHG Soap the night before surgery and the  morning of Surgery.  2.  If you choose to wash your hair, wash your hair first as usual with your  normal  shampoo.  3.  After you shampoo, rinse your hair and body thoroughly to  remove the  shampoo.                           4.  Use CHG as you would any other liquid soap.  You can apply chg directly  to the skin and wash                       Gently with a scrungie or clean washcloth.  5.  Apply the CHG Soap to your body ONLY FROM THE NECK DOWN.   Do not use on face/ open                           Wound or open sores. Avoid contact with eyes, ears mouth and genitals (private parts).                       Wash face,  Genitals (private parts) with your normal soap.             6.  Wash thoroughly, paying special attention to the area where your surgery  will be performed.  7.  Thoroughly rinse your body with warm water from the neck down.  8.  DO NOT shower/wash with your normal soap after using and rinsing off  the CHG Soap.                9.  Pat yourself dry with a clean towel.            10.  Wear clean pajamas.            11.  Place clean sheets on your bed the night of your first shower and do not  sleep with pets. Day of Surgery : Do not apply any lotions/deodorants the morning of surgery.  Please wear clean clothes to the hospital/surgery center.  FAILURE TO FOLLOW THESE INSTRUCTIONS MAY RESULT IN THE CANCELLATION OF YOUR SURGERY PATIENT SIGNATURE_________________________________  NURSE SIGNATURE__________________________________  ________________________________________________________________________

## 2023-02-22 ENCOUNTER — Encounter (HOSPITAL_COMMUNITY): Admission: RE | Admit: 2023-02-22 | Payer: BC Managed Care – PPO | Source: Ambulatory Visit

## 2023-02-22 ENCOUNTER — Other Ambulatory Visit: Payer: Self-pay

## 2023-02-22 ENCOUNTER — Encounter (HOSPITAL_COMMUNITY): Payer: Self-pay

## 2023-02-22 DIAGNOSIS — R011 Cardiac murmur, unspecified: Secondary | ICD-10-CM | POA: Insufficient documentation

## 2023-02-22 DIAGNOSIS — I10 Essential (primary) hypertension: Secondary | ICD-10-CM | POA: Insufficient documentation

## 2023-02-22 DIAGNOSIS — R7303 Prediabetes: Secondary | ICD-10-CM | POA: Insufficient documentation

## 2023-02-22 DIAGNOSIS — Z01812 Encounter for preprocedural laboratory examination: Secondary | ICD-10-CM | POA: Diagnosis present

## 2023-02-22 HISTORY — DX: Nontoxic goiter, unspecified: E04.9

## 2023-02-22 HISTORY — DX: Cardiac murmur, unspecified: R01.1

## 2023-02-22 LAB — CBC WITH DIFFERENTIAL/PLATELET
Abs Immature Granulocytes: 0.01 10*3/uL (ref 0.00–0.07)
Basophils Absolute: 0.1 10*3/uL (ref 0.0–0.1)
Basophils Relative: 1 %
Eosinophils Absolute: 0.2 10*3/uL (ref 0.0–0.5)
Eosinophils Relative: 3 %
HCT: 44.1 % (ref 36.0–46.0)
Hemoglobin: 14.5 g/dL (ref 12.0–15.0)
Immature Granulocytes: 0 %
Lymphocytes Relative: 37 %
Lymphs Abs: 2.6 10*3/uL (ref 0.7–4.0)
MCH: 31.3 pg (ref 26.0–34.0)
MCHC: 32.9 g/dL (ref 30.0–36.0)
MCV: 95.2 fL (ref 80.0–100.0)
Monocytes Absolute: 0.4 10*3/uL (ref 0.1–1.0)
Monocytes Relative: 5 %
Neutro Abs: 3.8 10*3/uL (ref 1.7–7.7)
Neutrophils Relative %: 54 %
Platelets: 207 10*3/uL (ref 150–400)
RBC: 4.63 MIL/uL (ref 3.87–5.11)
RDW: 12.9 % (ref 11.5–15.5)
WBC: 7.1 10*3/uL (ref 4.0–10.5)
nRBC: 0 % (ref 0.0–0.2)

## 2023-02-22 LAB — COMPREHENSIVE METABOLIC PANEL
ALT: 30 U/L (ref 0–44)
AST: 24 U/L (ref 15–41)
Albumin: 4 g/dL (ref 3.5–5.0)
Alkaline Phosphatase: 79 U/L (ref 38–126)
Anion gap: 9 (ref 5–15)
BUN: 13 mg/dL (ref 6–20)
CO2: 26 mmol/L (ref 22–32)
Calcium: 8.7 mg/dL — ABNORMAL LOW (ref 8.9–10.3)
Chloride: 105 mmol/L (ref 98–111)
Creatinine, Ser: 0.77 mg/dL (ref 0.44–1.00)
GFR, Estimated: >60 mL/min (ref >60)
Glucose, Bld: 148 mg/dL — ABNORMAL HIGH (ref 70–99)
Potassium: 3.9 mmol/L (ref 3.5–5.1)
Sodium: 140 mmol/L (ref 135–145)
Total Bilirubin: 0.4 mg/dL (ref 0.3–1.2)
Total Protein: 7.2 g/dL (ref 6.5–8.1)

## 2023-02-22 LAB — TYPE AND SCREEN
ABO/RH(D): A POS
Antibody Screen: NEGATIVE

## 2023-02-22 NOTE — Progress Notes (Addendum)
For Short Stay: COVID SWAB appointment date:  Bowel Prep reminder: N/A   For Anesthesia: PCP - Armando Gang, FNP  Cardiologist -   Chest x-ray - 01/23/23 EKG - 01/22/23 Stress Test -  ECHO -  Cardiac Cath -  Pacemaker/ICD device last checked: Pacemaker orders received: Device Rep notified:  Spinal Cord Stimulator: N/A  Sleep Study - N/A CPAP -   Fasting Blood Sugar - N/A Checks Blood Sugar _____ times a day Date and result of last Hgb A1c-5.7: 01/01/23  Last dose of GLP1 agonist- N/A GLP1 instructions:   Last dose of SGLT-2 inhibitors- N/A SGLT-2 instructions:   Blood Thinner Instructions: N/A Aspirin Instructions: Last Dose:  Activity level: Can go up a flight of stairs and activities of daily living without stopping and without chest pain and/or shortness of breath   Patient denies shortness of breath, fever, cough and chest pain at PAT appointment Hx: HTN,Pre-DIA,Heart murmur.  Patient verbalized understanding of instructions that were given to them at the PAT appointment. Patient was also instructed that they will need to review over the PAT instructions again at home before surgery.

## 2023-02-26 ENCOUNTER — Other Ambulatory Visit (HOSPITAL_COMMUNITY): Payer: Self-pay | Admitting: General Surgery

## 2023-02-26 DIAGNOSIS — R011 Cardiac murmur, unspecified: Secondary | ICD-10-CM

## 2023-02-26 NOTE — Progress Notes (Addendum)
Anesthesia Chart Review   Case: [id number] Date/Time: 03/05/23 1115   Procedures:      LAPAROSCOPIC SLEEVE GASTRECTOMY     UPPER GI ENDOSCOPY     HERNIA REPAIR HIATAL   Anesthesia type: General   Pre-op diagnosis: morbid obesity   Location: [facility] ROOM 01 / [facility]   Surgeons: Gaynelle Adu, MD       DISCUSSION:52 y.o. never smoker with h/o HTN, pre-diabetes, morbid obesity scheduled for above procedure 03/05/2023 with Dr. Gaynelle Adu.   Pt with systolic murmur on exam.  Will request echo prior to procedure. Discussed with Dr. Okey Dupre who agrees with plan.  ADDENDUM 03/04/23:  Echo completed and is grossly normal. Anticipate patient can proceed.   VS: BP (!) 140/75   Pulse 79   Temp 36.7 C (Oral)   Ht 5\' 2"  (1.575 m)   Wt 98 kg   SpO2 94%   BMI 39.51 kg/m   PROVIDERS: Armando Gang, FNP is PCP    LABS: Labs reviewed: Acceptable for surgery. (all labs ordered are listed, but only abnormal results are displayed)  Labs Reviewed  COMPREHENSIVE METABOLIC PANEL - Abnormal; Notable for the following components:      Result Value   Glucose, Bld 148 (*)    Calcium 8.7 (*)    All other components within normal limits  CBC WITH DIFFERENTIAL/PLATELET  TYPE AND SCREEN     IMAGES:  CXR 01/22/23:  FINDINGS: The cardiomediastinal contours are normal. The lungs are clear. Pulmonary vasculature is normal. No consolidation, pleural effusion, or pneumothorax. Minor thoracic spurring. No acute osseous abnormalities are seen.   IMPRESSION: No acute chest findings.     EKG 01/22/23:  NSR, rate 66   CV:  Echo 03/01/23:  IMPRESSIONS     1. Left ventricular ejection fraction, by estimation, is 60 to 65%. The  left ventricle has normal function. The left ventricle has no regional  wall motion abnormalities. Left ventricular diastolic parameters were  normal.   2. Right ventricular systolic function is normal. The right ventricular  size is normal. Tricuspid regurgitation  signal is inadequate for assessing  PA pressure.   3. A trivial pericardial effusion is present.   4. The mitral valve is normal in structure. Trivial mitral valve  regurgitation. No evidence of mitral stenosis.   5. The aortic valve is tricuspid. Aortic valve regurgitation is not  visualized. No aortic stenosis is present.   6. The inferior vena cava is normal in size with greater than 50%  respiratory variability, suggesting right atrial pressure of 3 mmHg.   Past Medical History:  Diagnosis Date   Actinic keratosis    Anxiety    Cancer (HCC)    skin ca   Depression    GERD (gastroesophageal reflux disease)    GERD (gastroesophageal reflux disease)    Goiter    Heart murmur    Hyperlipidemia    Hypertension    Hypertension    Pre-diabetes    Thyroid goiter     Past Surgical History:  Procedure Laterality Date   ABDOMINAL HYSTERECTOMY  06/2016   total   BREAST CYST ASPIRATION     ? side-neg   COLONOSCOPY     COLONOSCOPY WITH PROPOFOL N/A 02/19/2020   Procedure: COLONOSCOPY WITH PROPOFOL;  Surgeon: Pasty Spillers, MD;  Location: [facility] ENDOSCOPY;  Service: Gastroenterology;  Laterality: N/A;   NASAL SEPTUM SURGERY     TONSILLECTOMY     TONSILLECTOMY  MEDICATIONS:  Ascorbic Acid (VITAMIN C PO)   b complex vitamins capsule   Biotin w/ Vitamins C & E (HAIR SKIN & NAILS GUMMIES PO)   buPROPion (WELLBUTRIN SR) 150 MG 12 hr tablet   busPIRone (BUSPAR) 15 MG tablet   CALCIUM-VITAMIN D PO   Cholecalciferol (VITAMIN D3 PO)   Coenzyme Q10 (CO Q10 PO)   escitalopram (LEXAPRO) 20 MG tablet   MAGNESIUM PO   Multiple Vitamins-Minerals (WOMENS MULTIVITAMIN PO)   Omega-3 Fatty Acids (FISH OIL PO)   rosuvastatin (CRESTOR) 40 MG tablet   traZODone (DESYREL) 50 MG tablet   No current facility-administered medications for this encounter.   Marcille Blanco [facility] Short Stay/Anesthesiology [facility] Phone [phone] 03/04/2023 10:25 AM

## 2023-02-27 NOTE — Discharge Instructions (Signed)

## 2023-02-28 ENCOUNTER — Other Ambulatory Visit: Payer: Self-pay | Admitting: Family Medicine

## 2023-02-28 NOTE — Telephone Encounter (Signed)
Requested medications are due for refill today.  yes  Requested medications are on the active medications list.  yes  Last refill. 10/23/2022 #90 0 rf  Future visit scheduled.   no  Notes to clinic.  PCP listed is [name]    Requested Prescriptions  Pending Prescriptions Disp Refills   busPIRone (BUSPAR) 15 MG tablet [Pharmacy Med Name: BUSPIRONE HCL 15 MG TAB] 90 tablet 0    Sig: TAKE 1 TABLET BY MOUTH ONCE DAILY     Psychiatry: Anxiolytics/Hypnotics - Non-controlled Failed - 02/28/2023 10:15 AM      Failed - Valid encounter within last 12 months    Recent Outpatient Visits           1 year ago Morbid obesity [facility])   [facility] [place], [name], DO   1 year ago Morbid obesity [facility])   [facility] [place], [name], DO   1 year ago Routine general medical examination at a health care facility   [facility], [place] P, DO   2 years ago Mild episode of recurrent major depressive disorder [facility])   [facility] [place], [name], DO   2 years ago Routine general medical examination at a health care facility   [facility], [name], DO

## 2023-03-01 ENCOUNTER — Ambulatory Visit (HOSPITAL_COMMUNITY): Admission: RE | Admit: 2023-03-01 | Payer: BC Managed Care – PPO | Source: Ambulatory Visit

## 2023-03-01 DIAGNOSIS — Z01818 Encounter for other preprocedural examination: Secondary | ICD-10-CM | POA: Insufficient documentation

## 2023-03-01 DIAGNOSIS — R011 Cardiac murmur, unspecified: Secondary | ICD-10-CM | POA: Diagnosis not present

## 2023-03-01 DIAGNOSIS — I081 Rheumatic disorders of both mitral and tricuspid valves: Secondary | ICD-10-CM | POA: Insufficient documentation

## 2023-03-01 DIAGNOSIS — E785 Hyperlipidemia, unspecified: Secondary | ICD-10-CM | POA: Insufficient documentation

## 2023-03-01 LAB — ECHOCARDIOGRAM COMPLETE
Area-P 1/2: 4.49 cm2
S' Lateral: 2.6 cm

## 2023-03-01 NOTE — Telephone Encounter (Signed)
Not a [facility] patient.  

## 2023-03-01 NOTE — Progress Notes (Signed)
  Echocardiogram 2D Echocardiogram has been performed.  Paula Clay 03/01/2023, 8:41 AM

## 2023-03-01 NOTE — Telephone Encounter (Signed)
Requested Prescriptions  Refused Prescriptions Disp Refills   busPIRone (BUSPAR) 15 MG tablet [Pharmacy Med Name: BUSPIRONE HCL 15 MG TAB] 90 tablet 0    Sig: TAKE 1 TABLET BY MOUTH ONCE DAILY     Psychiatry: Anxiolytics/Hypnotics - Non-controlled Failed - 03/01/2023  8:09 AM      Failed - Valid encounter within last 12 months    Recent Outpatient Visits           1 year ago Morbid obesity [facility])   [facility] [place], [name], DO   1 year ago Morbid obesity [facility])   [facility] [place], [name], DO   1 year ago Routine general medical examination at a health care facility   [facility], [place] P, DO   2 years ago Mild episode of recurrent major depressive disorder [facility])   [place] [facility] [place], [name], DO   2 years ago Routine general medical examination at a health care facility   [facility], [name], DO

## 2023-03-04 ENCOUNTER — Encounter (HOSPITAL_COMMUNITY): Payer: Self-pay

## 2023-03-04 NOTE — Anesthesia Preprocedure Evaluation (Signed)
Anesthesia Evaluation  Patient identified by MRN, date of birth, ID band Patient awake    Reviewed: Allergy & Precautions, NPO status , Patient's Chart, lab work & pertinent test results, reviewed documented beta blocker date and time   History of Anesthesia Complications Negative for: history of anesthetic complications  Airway Mallampati: IV  TM Distance: >3 FB   Mouth opening: Limited Mouth Opening  Dental no notable dental hx.    Pulmonary neg pulmonary ROS   breath sounds clear to auscultation       Cardiovascular hypertension, (-) angina (-) CAD, (-) Past MI and (-) CABG (-) Valvular Problems/Murmurs Rhythm:Regular Rate:Normal     Neuro/Psych  PSYCHIATRIC DISORDERS Anxiety Depression    negative neurological ROS     GI/Hepatic hiatal hernia,GERD  Controlled,,(+) neg Cirrhosis        Endo/Other    Renal/GU Renal disease     Musculoskeletal   Abdominal   Peds  Hematology   Anesthesia Other Findings   Reproductive/Obstetrics                              Anesthesia Physical Anesthesia Plan  ASA: 2  Anesthesia Plan: General   Post-op Pain Management:    Induction: Intravenous  PONV Risk Score and Plan: Ondansetron, Aprepitant and Scopolamine patch - Pre-op  Airway Management Planned: Oral ETT and Video Laryngoscope Planned  Additional Equipment:   Intra-op Plan:   Post-operative Plan: Extubation in OR  Informed Consent: I have reviewed the patients History and Physical, chart, labs and discussed the procedure including the risks, benefits and alternatives for the proposed anesthesia with the patient or authorized representative who has indicated his/her understanding and acceptance.     Dental advisory given  Plan Discussed with: CRNA  Anesthesia Plan Comments: (See PAT note from 7/26 by Sherlie Ban PA-C/Jessica Ward PA-C )         Anesthesia Quick  Evaluation

## 2023-03-05 ENCOUNTER — Other Ambulatory Visit: Payer: Self-pay

## 2023-03-05 ENCOUNTER — Encounter (HOSPITAL_COMMUNITY): Payer: Self-pay | Admitting: General Surgery

## 2023-03-05 ENCOUNTER — Inpatient Hospital Stay (HOSPITAL_COMMUNITY): Payer: Self-pay | Admitting: Anesthesiology

## 2023-03-05 ENCOUNTER — Encounter (HOSPITAL_COMMUNITY)
Admission: RE | Disposition: A | Discharge status: Home / Self Care | Payer: Self-pay | Source: Home / Self Care | Attending: General Surgery

## 2023-03-05 ENCOUNTER — Inpatient Hospital Stay (HOSPITAL_COMMUNITY): Payer: BC Managed Care – PPO | Admitting: Physician Assistant

## 2023-03-05 ENCOUNTER — Inpatient Hospital Stay (HOSPITAL_COMMUNITY)
Admission: RE | Admit: 2023-03-05 | Discharge: 2023-03-06 | DRG: 621 | Disposition: A | Discharge status: Home / Self Care | Payer: BC Managed Care – PPO | Attending: General Surgery | Admitting: General Surgery

## 2023-03-05 DIAGNOSIS — I10 Essential (primary) hypertension: Secondary | ICD-10-CM | POA: Diagnosis present

## 2023-03-05 DIAGNOSIS — Z8262 Family history of osteoporosis: Secondary | ICD-10-CM | POA: Diagnosis not present

## 2023-03-05 DIAGNOSIS — F32A Depression, unspecified: Secondary | ICD-10-CM | POA: Diagnosis present

## 2023-03-05 DIAGNOSIS — E119 Type 2 diabetes mellitus without complications: Secondary | ICD-10-CM | POA: Diagnosis present

## 2023-03-05 DIAGNOSIS — Z9884 Bariatric surgery status: Principal | ICD-10-CM

## 2023-03-05 DIAGNOSIS — Z833 Family history of diabetes mellitus: Secondary | ICD-10-CM

## 2023-03-05 DIAGNOSIS — E049 Nontoxic goiter, unspecified: Secondary | ICD-10-CM | POA: Diagnosis present

## 2023-03-05 DIAGNOSIS — K219 Gastro-esophageal reflux disease without esophagitis: Secondary | ICD-10-CM | POA: Diagnosis present

## 2023-03-05 DIAGNOSIS — G4730 Sleep apnea, unspecified: Secondary | ICD-10-CM | POA: Diagnosis present

## 2023-03-05 DIAGNOSIS — Z6839 Body mass index (BMI) 39.0-39.9, adult: Secondary | ICD-10-CM | POA: Diagnosis not present

## 2023-03-05 DIAGNOSIS — Z8249 Family history of ischemic heart disease and other diseases of the circulatory system: Secondary | ICD-10-CM | POA: Diagnosis not present

## 2023-03-05 DIAGNOSIS — Z90710 Acquired absence of both cervix and uterus: Secondary | ICD-10-CM | POA: Diagnosis not present

## 2023-03-05 DIAGNOSIS — K449 Diaphragmatic hernia without obstruction or gangrene: Secondary | ICD-10-CM | POA: Diagnosis present

## 2023-03-05 DIAGNOSIS — Z79899 Other long term (current) drug therapy: Secondary | ICD-10-CM

## 2023-03-05 DIAGNOSIS — Z818 Family history of other mental and behavioral disorders: Secondary | ICD-10-CM | POA: Diagnosis not present

## 2023-03-05 DIAGNOSIS — E785 Hyperlipidemia, unspecified: Secondary | ICD-10-CM | POA: Diagnosis present

## 2023-03-05 DIAGNOSIS — Z8349 Family history of other endocrine, nutritional and metabolic diseases: Secondary | ICD-10-CM

## 2023-03-05 DIAGNOSIS — Z888 Allergy status to other drugs, medicaments and biological substances status: Secondary | ICD-10-CM | POA: Diagnosis not present

## 2023-03-05 DIAGNOSIS — F419 Anxiety disorder, unspecified: Secondary | ICD-10-CM | POA: Diagnosis present

## 2023-03-05 HISTORY — PX: LAPAROSCOPIC GASTRIC SLEEVE RESECTION: SHX5895

## 2023-03-05 HISTORY — DX: Other complications of anesthesia, initial encounter: T88.59XA

## 2023-03-05 HISTORY — PX: UPPER GI ENDOSCOPY: SHX6162

## 2023-03-05 HISTORY — PX: HIATAL HERNIA REPAIR: SHX195

## 2023-03-05 LAB — HEMOGLOBIN AND HEMATOCRIT, BLOOD
HCT: 43.6 % (ref 36.0–46.0)
Hemoglobin: 14.4 g/dL (ref 12.0–15.0)

## 2023-03-05 SURGERY — GASTRECTOMY, SLEEVE, LAPAROSCOPIC
Anesthesia: General

## 2023-03-05 MED ORDER — MIDAZOLAM HCL 2 MG/2ML IJ SOLN
INTRAMUSCULAR | Status: AC
  Filled 2023-03-05: qty 2

## 2023-03-05 MED ORDER — ACETAMINOPHEN 500 MG PO TABS
1000.0000 mg | ORAL_TABLET | ORAL | Status: AC
  Administered 2023-03-05: 1000 mg via ORAL
  Filled 2023-03-05: qty 2

## 2023-03-05 MED ORDER — BUPIVACAINE-EPINEPHRINE 0.25% -1:200000 IJ SOLN
INTRAMUSCULAR | Status: AC
  Filled 2023-03-05: qty 1

## 2023-03-05 MED ORDER — SCOPOLAMINE 1 MG/3DAYS TD PT72
1.0000 | MEDICATED_PATCH | TRANSDERMAL | Status: DC
  Administered 2023-03-05: 1.5 mg via TRANSDERMAL
  Filled 2023-03-05: qty 1

## 2023-03-05 MED ORDER — SUGAMMADEX SODIUM 200 MG/2ML IV SOLN
INTRAVENOUS | Status: DC | PRN
  Administered 2023-03-05: 200 mg via INTRAVENOUS

## 2023-03-05 MED ORDER — ONDANSETRON HCL 4 MG/2ML IJ SOLN
INTRAMUSCULAR | Status: DC | PRN
Start: 2023-03-05 — End: 2023-03-05
  Administered 2023-03-05: 4 mg via INTRAVENOUS

## 2023-03-05 MED ORDER — ORAL CARE MOUTH RINSE: 15.0000 mL | Freq: Once | OROMUCOSAL | Status: AC

## 2023-03-05 MED ORDER — ACETAMINOPHEN 500 MG PO TABS
1000.0000 mg | ORAL_TABLET | Freq: Three times a day (TID) | ORAL | Status: DC
  Administered 2023-03-05 – 2023-03-06 (×2): 1000 mg via ORAL
  Filled 2023-03-05 (×2): qty 2

## 2023-03-05 MED ORDER — BUSPIRONE HCL 5 MG PO TABS
15.0000 mg | ORAL_TABLET | Freq: Every day | ORAL | Status: DC
  Administered 2023-03-06: 15 mg via ORAL
  Filled 2023-03-05 (×2): qty 3

## 2023-03-05 MED ORDER — FENTANYL CITRATE (PF) 250 MCG/5ML IJ SOLN
INTRAMUSCULAR | Status: AC
  Filled 2023-03-05: qty 5

## 2023-03-05 MED ORDER — BUPIVACAINE LIPOSOME 1.3 % IJ SUSP: 20.0000 mL | Freq: Once | INTRAMUSCULAR | Status: DC

## 2023-03-05 MED ORDER — PROPOFOL 10 MG/ML IV BOLUS
INTRAVENOUS | Status: AC
  Filled 2023-03-05: qty 20

## 2023-03-05 MED ORDER — PHENYLEPHRINE 80 MCG/ML (10ML) SYRINGE FOR IV PUSH (FOR BLOOD PRESSURE SUPPORT)
PREFILLED_SYRINGE | INTRAVENOUS | Status: AC
  Filled 2023-03-05: qty 10

## 2023-03-05 MED ORDER — BUPIVACAINE-EPINEPHRINE 0.25% -1:200000 IJ SOLN
INTRAMUSCULAR | Status: DC | PRN
  Administered 2023-03-05: 30 mL

## 2023-03-05 MED ORDER — MORPHINE SULFATE (PF) 2 MG/ML IV SOLN: 1.0000 mg | INTRAVENOUS | Status: DC | PRN

## 2023-03-05 MED ORDER — ROCURONIUM BROMIDE 10 MG/ML (PF) SYRINGE
PREFILLED_SYRINGE | INTRAVENOUS | Status: DC | PRN
  Administered 2023-03-05: 30 mg via INTRAVENOUS
  Administered 2023-03-05: 70 mg via INTRAVENOUS

## 2023-03-05 MED ORDER — BUPROPION HCL ER (SR) 150 MG PO TB12: 150.0000 mg | ORAL_TABLET | Freq: Every day | ORAL | Status: DC

## 2023-03-05 MED ORDER — LIDOCAINE 2% (20 MG/ML) 5 ML SYRINGE
INTRAMUSCULAR | Status: DC | PRN
  Administered 2023-03-05: 100 mg via INTRAVENOUS

## 2023-03-05 MED ORDER — SODIUM CHLORIDE 0.9 % IV SOLN: 12.5000 mg | Freq: Four times a day (QID) | INTRAVENOUS | Status: DC | PRN

## 2023-03-05 MED ORDER — FENTANYL CITRATE PF 50 MCG/ML IJ SOSY
25.0000 ug | PREFILLED_SYRINGE | INTRAMUSCULAR | Status: DC | PRN
  Administered 2023-03-05: 25 ug via INTRAVENOUS

## 2023-03-05 MED ORDER — HEPARIN SODIUM (PORCINE) 5000 UNIT/ML IJ SOLN
5000.0000 [IU] | Freq: Three times a day (TID) | INTRAMUSCULAR | Status: DC
  Administered 2023-03-05 – 2023-03-06 (×2): 5000 [IU] via SUBCUTANEOUS
  Filled 2023-03-05 (×2): qty 1

## 2023-03-05 MED ORDER — ROCURONIUM BROMIDE 10 MG/ML (PF) SYRINGE
PREFILLED_SYRINGE | INTRAVENOUS | Status: AC
  Filled 2023-03-05: qty 10

## 2023-03-05 MED ORDER — BUPROPION HCL 75 MG PO TABS
75.0000 mg | ORAL_TABLET | Freq: Four times a day (QID) | ORAL | Status: DC
  Administered 2023-03-06: 75 mg via ORAL
  Filled 2023-03-05 (×2): qty 1

## 2023-03-05 MED ORDER — ACETAMINOPHEN 160 MG/5ML PO SOLN
1000.0000 mg | Freq: Three times a day (TID) | ORAL | Status: DC
  Administered 2023-03-05: 1000 mg via ORAL
  Filled 2023-03-05: qty 40.6

## 2023-03-05 MED ORDER — LACTATED RINGERS IR SOLN
Status: DC | PRN
  Administered 2023-03-05: 1000 mL

## 2023-03-05 MED ORDER — MIDAZOLAM HCL 2 MG/2ML IJ SOLN
INTRAMUSCULAR | Status: DC | PRN
  Administered 2023-03-05: 2 mg via INTRAVENOUS

## 2023-03-05 MED ORDER — FENTANYL CITRATE (PF) 250 MCG/5ML IJ SOLN
INTRAMUSCULAR | Status: DC | PRN
  Administered 2023-03-05: 50 ug via INTRAVENOUS
  Administered 2023-03-05: 100 ug via INTRAVENOUS
  Administered 2023-03-05 (×2): 50 ug via INTRAVENOUS

## 2023-03-05 MED ORDER — ONDANSETRON HCL 4 MG/2ML IJ SOLN: 4.0000 mg | Freq: Four times a day (QID) | INTRAMUSCULAR | Status: DC | PRN

## 2023-03-05 MED ORDER — FENTANYL CITRATE PF 50 MCG/ML IJ SOSY
PREFILLED_SYRINGE | INTRAMUSCULAR | Status: AC
  Filled 2023-03-05: qty 2

## 2023-03-05 MED ORDER — ACETAMINOPHEN 10 MG/ML IV SOLN: 1000.0000 mg | Freq: Once | INTRAVENOUS | Status: DC | PRN

## 2023-03-05 MED ORDER — ONDANSETRON HCL 4 MG/2ML IJ SOLN
INTRAMUSCULAR | Status: AC
  Filled 2023-03-05: qty 2

## 2023-03-05 MED ORDER — APREPITANT 40 MG PO CAPS
40.0000 mg | ORAL_CAPSULE | ORAL | Status: AC
  Administered 2023-03-05: 40 mg via ORAL
  Filled 2023-03-05: qty 1

## 2023-03-05 MED ORDER — SIMETHICONE 80 MG PO CHEW
80.0000 mg | CHEWABLE_TABLET | Freq: Four times a day (QID) | ORAL | Status: DC | PRN
  Administered 2023-03-05 – 2023-03-06 (×2): 80 mg via ORAL
  Filled 2023-03-05 (×2): qty 1

## 2023-03-05 MED ORDER — 0.9 % SODIUM CHLORIDE (POUR BTL) OPTIME
TOPICAL | Status: DC | PRN
Start: 2023-03-05 — End: 2023-03-05
  Administered 2023-03-05: 1000 mL

## 2023-03-05 MED ORDER — CHLORHEXIDINE GLUCONATE 0.12 % MT SOLN
15.0000 mL | Freq: Once | OROMUCOSAL | Status: AC
  Administered 2023-03-05: 15 mL via OROMUCOSAL

## 2023-03-05 MED ORDER — PHENOL 1.4 % MT LIQD: 1.0000 | OROMUCOSAL | Status: DC | PRN

## 2023-03-05 MED ORDER — ENSURE MAX PROTEIN PO LIQD
2.0000 [oz_av] | ORAL | Status: DC
  Administered 2023-03-06 (×2): 2 [oz_av] via ORAL

## 2023-03-05 MED ORDER — BUPIVACAINE LIPOSOME 1.3 % IJ SUSP
INTRAMUSCULAR | Status: DC | PRN
  Administered 2023-03-05: 20 mL

## 2023-03-05 MED ORDER — PHENYLEPHRINE 80 MCG/ML (10ML) SYRINGE FOR IV PUSH (FOR BLOOD PRESSURE SUPPORT)
PREFILLED_SYRINGE | INTRAVENOUS | Status: DC | PRN
  Administered 2023-03-05 (×2): 160 ug via INTRAVENOUS

## 2023-03-05 MED ORDER — DEXAMETHASONE SODIUM PHOSPHATE 10 MG/ML IJ SOLN
INTRAMUSCULAR | Status: DC | PRN
  Administered 2023-03-05: 10 mg via INTRAVENOUS

## 2023-03-05 MED ORDER — BUPIVACAINE LIPOSOME 1.3 % IJ SUSP
INTRAMUSCULAR | Status: AC
  Filled 2023-03-05: qty 20

## 2023-03-05 MED ORDER — SODIUM CHLORIDE 0.9 % IV SOLN
2.0000 g | INTRAVENOUS | Status: AC
  Administered 2023-03-05: 2 g via INTRAVENOUS
  Filled 2023-03-05: qty 2

## 2023-03-05 MED ORDER — KCL IN DEXTROSE-NACL 20-5-0.45 MEQ/L-%-% IV SOLN
INTRAVENOUS | Status: DC
  Filled 2023-03-05 (×2): qty 1000

## 2023-03-05 MED ORDER — OXYCODONE HCL 5 MG/5ML PO SOLN: 5.0000 mg | Freq: Once | ORAL | Status: DC | PRN

## 2023-03-05 MED ORDER — PROPOFOL 10 MG/ML IV BOLUS
INTRAVENOUS | Status: DC | PRN
  Administered 2023-03-05: 130 mg via INTRAVENOUS

## 2023-03-05 MED ORDER — STERILE WATER FOR IRRIGATION IR SOLN
Status: DC | PRN
Start: 2023-03-05 — End: 2023-03-05
  Administered 2023-03-05: 1000 mL

## 2023-03-05 MED ORDER — DEXAMETHASONE SODIUM PHOSPHATE 4 MG/ML IJ SOLN: 4.0000 mg | INTRAMUSCULAR | Status: DC

## 2023-03-05 MED ORDER — LIDOCAINE HCL (PF) 2 % IJ SOLN
INTRAMUSCULAR | Status: AC
  Filled 2023-03-05: qty 5

## 2023-03-05 MED ORDER — CHLORHEXIDINE GLUCONATE 4 % EX SOLN: Freq: Once | CUTANEOUS | Status: DC

## 2023-03-05 MED ORDER — ESCITALOPRAM OXALATE 20 MG PO TABS
20.0000 mg | ORAL_TABLET | Freq: Every day | ORAL | Status: DC
  Administered 2023-03-06: 20 mg via ORAL
  Filled 2023-03-05: qty 1

## 2023-03-05 MED ORDER — ONDANSETRON HCL 4 MG/2ML IJ SOLN: 4.0000 mg | Freq: Once | INTRAMUSCULAR | Status: DC | PRN

## 2023-03-05 MED ORDER — LACTATED RINGERS IV SOLN: INTRAVENOUS | Status: DC

## 2023-03-05 MED ORDER — HEPARIN SODIUM (PORCINE) 5000 UNIT/ML IJ SOLN
5000.0000 [IU] | INTRAMUSCULAR | Status: AC
  Administered 2023-03-05: 5000 [IU] via SUBCUTANEOUS
  Filled 2023-03-05: qty 1

## 2023-03-05 MED ORDER — OXYCODONE HCL 5 MG/5ML PO SOLN: 5.0000 mg | Freq: Four times a day (QID) | ORAL | Status: DC | PRN

## 2023-03-05 MED ORDER — OXYCODONE HCL 5 MG PO TABS: 5.0000 mg | ORAL_TABLET | Freq: Once | ORAL | Status: DC | PRN

## 2023-03-05 MED ORDER — PANTOPRAZOLE SODIUM 40 MG IV SOLR
40.0000 mg | Freq: Every day | INTRAVENOUS | Status: DC
  Administered 2023-03-05: 40 mg via INTRAVENOUS
  Filled 2023-03-05: qty 10

## 2023-03-05 MED ORDER — DEXAMETHASONE SODIUM PHOSPHATE 10 MG/ML IJ SOLN
INTRAMUSCULAR | Status: AC
  Filled 2023-03-05: qty 1

## 2023-03-05 SURGICAL SUPPLY — 98 items
ANTIFOG SOL W/FOAM PAD STRL (MISCELLANEOUS) ×1
APL PRP STRL LF DISP 70% ISPRP (MISCELLANEOUS) ×2
APL SRG 32X5 SNPLK LF DISP (MISCELLANEOUS)
APL SWBSTK 6 STRL LF DISP (MISCELLANEOUS) ×1
APPLICATOR COTTON TIP 6 STRL (MISCELLANEOUS) ×1 IMPLANT
APPLICATOR COTTON TIP 6IN STRL (MISCELLANEOUS) ×1
APPLIER CLIP ROT 10 11.4 M/L (STAPLE)
APPLIER CLIP ROT 13.4 12 LRG (CLIP) ×1
APR CLP LRG 13.4X12 ROT 20 MLT (CLIP) ×1
APR CLP MED LRG 11.4X10 (STAPLE)
BAG COUNTER SPONGE SURGICOUNT (BAG) IMPLANT
BAG SPNG CNTER NS LX DISP (BAG)
BLADE EXTENDED COATED 6.5IN (ELECTRODE) IMPLANT
BLADE HEX COATED 2.75 (ELECTRODE) ×1 IMPLANT
BLADE SURG SZ11 CARB STEEL (BLADE) ×1 IMPLANT
CABLE HIGH FREQUENCY MONO STRZ (ELECTRODE) IMPLANT
CHLORAPREP W/TINT 26 (MISCELLANEOUS) ×2 IMPLANT
CLIP APPLIE ROT 10 11.4 M/L (STAPLE) IMPLANT
CLIP APPLIE ROT 13.4 12 LRG (CLIP) IMPLANT
COVER MAYO STAND STRL (DRAPES) IMPLANT
COVER SURGICAL LIGHT HANDLE (MISCELLANEOUS) ×1 IMPLANT
DEVICE SUT QUICK LOAD TK 5 (SUTURE) IMPLANT
DEVICE SUT TI-KNOT TK 5X26 (SUTURE) IMPLANT
DEVICE SUTURE ENDOST 10MM (ENDOMECHANICALS) IMPLANT
DISSECTOR BLUNT TIP ENDO 5MM (MISCELLANEOUS) IMPLANT
DRAIN PENROSE 0.5X18 (DRAIN) ×1 IMPLANT
DRAPE LAPAROSCOPIC ABDOMINAL (DRAPES) ×1 IMPLANT
DRAPE UTILITY XL STRL (DRAPES) ×2 IMPLANT
DRAPE WARM FLUID 44X44 (DRAPES) IMPLANT
DRSG TEGADERM 2-3/8X2-3/4 SM (GAUZE/BANDAGES/DRESSINGS) ×6 IMPLANT
ELECT L-HOOK LAP 45CM DISP (ELECTROSURGICAL)
ELECT REM PT RETURN 15FT ADLT (MISCELLANEOUS) ×1 IMPLANT
ELECTRODE L-HOOK LAP 45CM DISP (ELECTROSURGICAL) IMPLANT
GAUZE SPONGE 2X2 8PLY STRL LF (GAUZE/BANDAGES/DRESSINGS) IMPLANT
GAUZE SPONGE 4X4 12PLY STRL (GAUZE/BANDAGES/DRESSINGS) ×1 IMPLANT
GLOVE BIO SURGEON STRL SZ7.5 (GLOVE) ×1 IMPLANT
GLOVE INDICATOR 8.0 STRL GRN (GLOVE) ×1 IMPLANT
GOWN STRL REUS W/ TWL XL LVL3 (GOWN DISPOSABLE) ×3 IMPLANT
GOWN STRL REUS W/TWL XL LVL3 (GOWN DISPOSABLE) ×3
GRASPER SUT TROCAR 14GX15 (MISCELLANEOUS) ×1 IMPLANT
HANDLE SUCTION POOLE (INSTRUMENTS) IMPLANT
IRRIG SUCT STRYKERFLOW 2 WTIP (MISCELLANEOUS) ×1
IRRIGATION SUCT STRKRFLW 2 WTP (MISCELLANEOUS) ×1 IMPLANT
KIT BASIN OR (CUSTOM PROCEDURE TRAY) ×1 IMPLANT
KIT TURNOVER KIT A (KITS) IMPLANT
MARKER SKIN DUAL TIP RULER LAB (MISCELLANEOUS) ×1 IMPLANT
MAT PREVALON FULL STRYKER (MISCELLANEOUS) ×1 IMPLANT
NDL SPNL 22GX3.5 QUINCKE BK (NEEDLE) ×1 IMPLANT
NEEDLE SPNL 22GX3.5 QUINCKE BK (NEEDLE) ×1
NS IRRIG 1000ML POUR BTL (IV SOLUTION) ×1 IMPLANT
PACK GENERAL/GYN (CUSTOM PROCEDURE TRAY) ×1 IMPLANT
PACK UNIVERSAL I (CUSTOM PROCEDURE TRAY) ×1 IMPLANT
PENCIL SMOKE EVACUATOR (MISCELLANEOUS) IMPLANT
RELOAD STAPLE 60 3.6 BLU REG (STAPLE) ×1 IMPLANT
RELOAD STAPLE 60 3.8 GOLD REG (STAPLE) IMPLANT
RELOAD STAPLE 60 4.1 GRN THCK (STAPLE) ×1 IMPLANT
RELOAD STAPLE 60 BLK VRY/THCK (STAPLE) IMPLANT
SCISSORS LAP 5X45 EPIX DISP (ENDOMECHANICALS) IMPLANT
SEALANT SURGICAL APPL DUAL CAN (MISCELLANEOUS) IMPLANT
SET TUBE SMOKE EVAC HIGH FLOW (TUBING) ×1 IMPLANT
SHEARS HARMONIC ACE PLUS 45CM (MISCELLANEOUS) ×1 IMPLANT
SLEEVE ADV FIXATION 5X100MM (TROCAR) ×2 IMPLANT
SLEEVE GASTRECTOMY 40FR VISIGI (MISCELLANEOUS) ×1 IMPLANT
SOLUTION ANTFG W/FOAM PAD STRL (MISCELLANEOUS) ×1 IMPLANT
SPIKE FLUID TRANSFER (MISCELLANEOUS) ×1 IMPLANT
STAPLE LINE REINFORCEMENT LAP (STAPLE) IMPLANT
STAPLER ECHELON BIOABSB 60 FLE (MISCELLANEOUS) IMPLANT
STAPLER ECHELON LONG 60 440 (INSTRUMENTS) ×1 IMPLANT
STAPLER RELOAD 60MM BLK (STAPLE)
STAPLER RELOAD BLUE 60MM (STAPLE) ×5
STAPLER RELOAD GOLD 60MM (STAPLE)
STAPLER RELOAD GREEN 60MM (STAPLE) ×1
STAPLER VISISTAT 35W (STAPLE) ×1 IMPLANT
STRIP CLOSURE SKIN 1/2X4 (GAUZE/BANDAGES/DRESSINGS) ×1 IMPLANT
SUCTION POOLE HANDLE (INSTRUMENTS)
SUT MNCRL AB 4-0 PS2 18 (SUTURE) ×1 IMPLANT
SUT SILK 2 0 (SUTURE)
SUT SILK 2 0 SH CR/8 (SUTURE) IMPLANT
SUT SILK 2-0 18XBRD TIE 12 (SUTURE) IMPLANT
SUT SILK 3 0 (SUTURE)
SUT SILK 3 0 SH CR/8 (SUTURE) IMPLANT
SUT SILK 3-0 18XBRD TIE 12 (SUTURE) IMPLANT
SUT SURGIDAC NAB ES-9 0 48 120 (SUTURE) IMPLANT
SUT VICRYL 0 TIES 12 18 (SUTURE) ×1 IMPLANT
SUT VICRYL 2 0 18 UND BR (SUTURE) IMPLANT
SYR 20ML LL LF (SYRINGE) ×1 IMPLANT
SYR 50ML LL SCALE MARK (SYRINGE) ×1 IMPLANT
SYS KII OPTICAL ACCESS 15MM (TROCAR) ×1
SYSTEM KII OPTICAL ACCESS 15MM (TROCAR) ×1 IMPLANT
TOWEL OR 17X26 10 PK STRL BLUE (TOWEL DISPOSABLE) ×2 IMPLANT
TOWEL OR NON WOVEN STRL DISP B (DISPOSABLE) ×1 IMPLANT
TRAY FOLEY MTR SLVR 16FR STAT (SET/KITS/TRAYS/PACK) IMPLANT
TROCAR ADV FIXATION 5X100MM (TROCAR) ×1 IMPLANT
TROCAR XCEL NON-BLD 5MMX100MML (ENDOMECHANICALS) ×1 IMPLANT
TROCAR Z-THREAD OPTICAL 5X100M (TROCAR) IMPLANT
TUBING CONNECTING 10 (TUBING) ×2 IMPLANT
TUBING ENDO SMARTCAP (MISCELLANEOUS) ×1 IMPLANT
YANKAUER SUCT BULB TIP NO VENT (SUCTIONS) IMPLANT

## 2023-03-05 NOTE — Op Note (Signed)
03/05/2023 Paula Clay 04/15/1971 [id number]   PRE-OPERATIVE DIAGNOSIS:   Obesity, Class II, BMI -39.9,  Prediabetes  Essential hypertension  Hyperlipidemia, unspecified hyperlipidemia type  Possible Sliding hiatal hernia   POST-OPERATIVE DIAGNOSIS:  same  PROCEDURE:  Procedure(s): LAPAROSCOPIC SLEEVE GASTRECTOMY WITH SLIDING HIATAL HERNIA REPAIR UPPER GI ENDOSCOPY Laparoscopic bilateral TAP block  SURGEON:  Surgeon(s): Atilano Ina, MD FACS FASMBS  ASSISTANTS: Phylliss Blakes MD FACS  ANESTHESIA:   general  DRAINS: none   BOUGIE: 40 fr ViSiGi  LOCAL MEDICATIONS USED:   Exparel  EBL: minimal  SPECIMEN:  Source of Specimen:  Greater curvature of stomach  DISPOSITION OF SPECIMEN:  PATHOLOGY  COUNTS:  YES  INDICATION FOR PROCEDURE: This is a very pleasant 52 y.o.-year-old morbidly obese female who has had unsuccessful attempts for sustained weight loss. The patient presents today for a planned laparoscopic sleeve gastrectomy with upper endoscopy. We have discussed the risk and benefits of the procedure extensively preoperatively. Please see my separate notes.  PROCEDURE: After obtaining informed consent and receiving 5000 units of subcutaneous heparin, the patient was brought to the operating room at [facility] and placed supine on the operating room table. General endotracheal anesthesia was established. Sequential compression devices were placed. A orogastric tube was placed. The patient's abdomen was prepped and draped in the usual standard surgical fashion. The patient received preoperative IV antibiotic. A surgical timeout was performed. ERAS protocol used.   Access to the abdomen was achieved using a 5 mm 0 laparoscope thru a 5 mm trocar In the left upper Quadrant 2 fingerbreadths below the left subcostal margin using the Optiview technique. Pneumoperitoneum was smoothly established up to 15 mm of mercury. The laparoscope was advanced and the abdominal  cavity was surveilled. The patient was then placed in reverse Trendelenburg.   A 5 mm trocar was placed slightly above and to the left of the umbilicus under direct visualization.  The [facility] liver retractor was placed under the left lobe of the liver through a 5 mm trocar incision site in the subxiphoid position. A 5 mm trocar was placed in the lateral right upper quadrant along with a 15 mm trocar in the mid right abdomen. A final 5 mm trocar was placed in the lateral LUQ.  All under direct visualization after exparel had been infiltrated in bilateral lateral upper abdominal walls as a TAP block for postoperative pain relief.  The stomach was inspected. It was completely decompressed and the orogastric tube was removed.  There is a small anterior dimple that was obviously visible.  The patient's preop upper GI suggested a small sliding hiatal hernia.  The gastrohepatic ligament was incised with harmonic scalpel. The right crus was identified. We identified the crossing fat along the right crus. The adipose tissue just above this area was incised with harmonic scalpel. I then bluntly dissected out this area and identified the left crus. There was evidence of a hiatal hernia. I then mobilized the esophagus. The left and right crus were further mobilized with blunt dissection. I was then able to reapproximate the left and right crus with 0 Ethibond using an Endostitch suture device and securing it with a titanium tyknot. I placed a second suture in a similar fashion.   We identified the pylorus and measured 6 cm proximal to the pylorus and identified an area of where we would start taking down the short gastric vessels. Harmonic scalpel was used to take down the short gastric vessels along the greater curvature  of the stomach. We were able to enter the lesser sac. We continued to march along the greater curvature of the stomach taking down the short gastrics. As we approached the gastrosplenic ligament we  took care in this area not to injure the spleen. We were able to take down the entire gastrosplenic ligament. We then mobilized the fundus away from the left crus of diaphragm. There were not any significant posterior gastric avascular attachments. This left the stomach completely mobilized. No vessels had been taken down along the lesser curvature of the stomach.  We then reidentified the pylorus. A 40Fr ViSiGi was then placed in the oropharynx and advanced down into the stomach and placed in the distal antrum and positioned along the lesser curvature. It was placed under suction which secured the 40Fr ViSiGi in place along the lesser curve. Then using the Ethicon echelon 60 mm stapler with a green load with ethicon staple line reinforcement (ESLR), I placed a stapler along the antrum approximately 5 cm from the pylorus. The stapler was angled so that there is ample room at the angularis incisura. I then fired the first staple load after inspecting it posteriorly to ensure adequate space both anteriorly and posteriorly.   At this point I started using 60 mm blue load staple cartridges with ESLR. The echelon stapler was then repositioned with a 60 mm blue load with ESLR and we continued to march up along the ViSiGi. My assistant was holding traction along the greater curvature stomach along the cauterized short gastric vessels ensuring that the stomach was symmetrically retracted. Prior to each firing of the staple, we rotated the stomach to ensure that there is adequate stomach left.  As we approached the fundus, I used 60 mm blue cartridge with ESLR aiming  lateral to the GE junction after mobilizing some of the esophageal fat pad.  The sleeve was inspected. There is no evidence of cork screw. The staple line appeared hemostatic. The CRNA inflated the ViSiGi to the green zone and the upper abdomen was flooded with saline. There were no bubbles. The sleeve was decompressed and the ViSiGi removed.   Pneumoperitoneum was reduced to 8 mmHg so we can inspect for bleeding along the staple line.  My assistant scrubbed out and performed an upper endoscopy. The sleeve easily distended with air and the scope was easily advanced to the pylorus. There is no evidence of internal bleeding or cork screwing. There was no narrowing at the angularis. There is no evidence of bubbles. Please see her operative note for further details. The gastric sleeve was decompressed and the endoscope was removed.  The greater curvature the stomach was grasped with a laparoscopic grasper and removed from the 15 mm trocar site.  The liver retractor was removed. I then closed the 15 mm trocar site with 1 interrupted 0 Vicryl sutures through the fascia using the endoclose. The closure was viewed laparoscopically and it was airtight. Remaining Exparel was then infiltrated in the preperitoneal spaces around the trocar sites. Pneumoperitoneum was released. All trocar sites were closed with a 4-0 Monocryl in a subcuticular fashion followed by the application of steri-strips, and bandaids. The patient was extubated and taken to the recovery room in stable condition. All needle, instrument, and sponge counts were correct x2. There are no immediate complications  (1) 60 mm green with ESLR (0) 60 mm gold with ESLR (5) 60 mm blue with ESLR  PLAN OF CARE: Admit to inpatient   PATIENT DISPOSITION:  PACU -  hemodynamically stable.   Delay start of Pharmacological VTE agent (>24hrs) due to surgical blood loss or risk of bleeding:  no  Mary Sella. Andrey Campanile, MD, FACS FASMBS General, Bariatric, & Minimally Invasive Surgery [facility], [place]

## 2023-03-05 NOTE — H&P (Signed)
PROVIDER: Keyundra Fant Sherril Cong, MD  MRN: [id number] DOB: [birth year] DATE OF ENCOUNTER: 02/22/2023 Subjective  Chief Complaint: Bariatric pre op   History of Present Illness: Paula Clay is a 52 y.o. female who is seen today for regarding her obesity and related comorbidities which include dyslipidemia, prediabetes and hypertension.  Initially met her back in May to discuss bariatric surgery. She denies any medical changes since I last saw her. She denies any trips to the emergency room. She has occasional food related GERD. She has had a prior hysterectomy.  No chest pain, chest pressure, shortness of breath, dyspnea on exertion.  She has been evaluated by psychology and nutrition.  She had an EKG, chest x-ray upper GI & labs  Review of Systems: A complete review of systems was obtained from the patient. I have reviewed this information and discussed as appropriate with the patient. See HPI as well for other ROS.  ROS  Medical History: Past Medical History: Diagnosis Date Actinic keratosis sees dermatology regularly Anxiety Depression Diabetes mellitus type 2, uncomplicated (CMS/HHS-HCC) pre GERD (gastroesophageal reflux disease) History of motion sickness Hyperlipidemia Hypertension Sleep apnea Thyroid disease has goiter; currently not on medication  Patient Active Problem List Diagnosis Hyperlipidemia Anxiety Actinic keratosis Thyroid disease Prediabetes Essential hypertension  Past Surgical History: Procedure Laterality Date deviated septum repair HYSTERECTOMY 55yrs ago TONSILLECTOMY   Allergies Allergen Reactions Lisinopril Cough  Current Outpatient Medications on File Prior to Visit Medication Sig Dispense Refill busPIRone (BUSPAR) 15 MG tablet Take by mouth escitalopram oxalate (LEXAPRO) 20 MG tablet Take by mouth MULTIVITAMIN ORAL Take 1 tablet by mouth daily. rosuvastatin (CRESTOR) 40 MG tablet APRI 0.15-0.03 mg tablet (Patient not taking:  Reported on 02/22/2023) hydroCHLOROthiazide (HYDRODIURIL) 25 MG tablet Take 1 tablet (25 mg total) by mouth once daily. 90 tablet 3 norethindrone (AYGESTIN) 5 mg tablet (Patient not taking: Reported on 02/22/2023)  No current facility-administered medications on file prior to visit.  Family History Problem Relation Age of Onset Obesity Mother Hyperlipidemia (Elevated cholesterol) Mother Diabetes Mother Deep vein thrombosis (DVT or abnormal blood clot formation) Mother High blood pressure (Hypertension) Mother Diabetes type II Mother Bipolar disorder Mother Obesity Father Hyperlipidemia (Elevated cholesterol) Father Diabetes Father Diabetes type II Father Thyroid disease Sister High blood pressure (Hypertension) Maternal Grandmother Thyroid disease Maternal Grandmother Osteoporosis (Thinning of bones) Paternal Grandmother Glaucoma Paternal Grandmother   Social History  Tobacco Use Smoking Status Never Smokeless Tobacco Never   Social History  Socioeconomic History Marital status: Married Tobacco Use Smoking status: Never Smokeless tobacco: Never Substance and Sexual Activity Alcohol use: No Alcohol/week: 0.0 standard drinks of alcohol Drug use: No Sexual activity: Yes Birth control/protection: OCP Social History Narrative Divorced. Lives with son, born 76 and daughter, born 2004 and three dogs. BAchelor's degree and works as a Runner, broadcasting/film/video in autistic class at [employer]. Walks 30 minutes two days a week.  Objective:  Vitals: 02/22/23 0832 02/22/23 0834 BP: 114/80 Pulse: 97 Temp: 36.7 C (98.1 F) SpO2: 96% Weight: 98.2 kg (216 lb 6.4 oz) Height: 157.5 cm (5\' 2" ) PainSc: 0-No pain  Body mass index is 39.58 kg/m.  Gen: alert, NAD, non-toxic appearing Pupils: equal, no scleral icterus Pulm: Lungs clear to auscultation, symmetric chest rise CV: regular rate and rhythm Abd: soft, nontender, nondistended. old trocar sites. No cellulitis. No incisional  hernia Ext: no edema, Skin: no rash, no jaundice  Labs, Imaging and Diagnostic Testing:  01/22/23 - upper GI - small sliding HHR; cxr - ok Labs January 01, 2023 Cholesterol 261, triglycerides 175, LDL 173; globin A1c 5.7  Assessment and Plan: Diagnoses and all orders for this visit:  Obesity, Class II, BMI 35-39.9, unspecified  Prediabetes  Essential hypertension  Hyperlipidemia, unspecified hyperlipidemia type  Sliding hiatal hernia    We reviewed her workup. We reviewed her labs. We reviewed her upper GI. I discussed the finding of a small sliding hiatal hernia. We discussed that we would test for 1 intraoperatively. If found to have 1 I would recommend repair. I discussed what that would involve. We rediscussed the preoperative course. We also discussed the immediate postoperative course along with the typical hospitalization. We discussed the typical issues that we see after surgery. She has attended her preop education class. She reviewed the surgical consent form and signed. I offered to rediscussed steps of surgery along with risk and benefits but she declined. Her questions were asked and answered.  This patient encounter took 22 minutes today to perform the following: take history, perform exam, review outside records, interpret imaging, counsel the patient on their diagnosis and document encounter, findings & plan in the EHR  No follow-ups on file.  Mary Sella. Andrey Campanile MD FACS General, Minimally Invasive, & Bariatric Surgery Electronically signed by Gara Kroner, MD at 02/22/2023 9:01 AM EDT

## 2023-03-05 NOTE — Op Note (Signed)
Preoperative diagnosis: laparoscopic sleeve gastrectomy  Postoperative diagnosis: Same   Procedure: Upper endoscopy   Surgeon: Berna Bue, M.D.  Anesthesia: Gen.   Description of procedure: The endoscope was placed in the mouth and oropharynx and under endoscopic vision it was advanced to the esophagogastric junction which was identified at 34cm from the teeth.  The pouch was tensely insufflated while the upper abdomen was flooded with irrigation to perform a leak test, which was negative. No bubbles were seen.  The staple line was hemostatic and the lumen was evenly tubular without undue narrowing, angulation or twisting specifically at the incisura angularis. The lumen was decompressed and the scope was withdrawn without difficulty.    Berna Bue, M.D. General, Bariatric, & Minimally Invasive Surgery [facility] [place]

## 2023-03-05 NOTE — Transfer of Care (Signed)
Immediate Anesthesia Transfer of Care Note  Patient: Paula Clay  Procedure(s) Performed: LAPAROSCOPIC SLEEVE GASTRECTOMY UPPER GI ENDOSCOPY HERNIA REPAIR HIATAL  Patient Location: PACU  Anesthesia Type:General  Level of Consciousness: awake, alert , and oriented  Airway & Oxygen Therapy: Patient Spontanous Breathing and Patient connected to face mask oxygen  Post-op Assessment: Report given to RN and Post -op Vital signs reviewed and stable  Post vital signs: Reviewed and stable  Last Vitals:  Vitals Value Taken Time  BP 142/76 03/05/23 1347  Temp    Pulse 94 03/05/23 1348  Resp 16 03/05/23 1348  SpO2 100 % 03/05/23 1348  Vitals shown include unfiled device data.  Last Pain:  Vitals:   03/05/23 0922  TempSrc: Oral  PainSc: 0-No pain      Patients Stated Pain Goal: 3 (03/05/23 1610)  Complications: No notable events documented.

## 2023-03-05 NOTE — Progress Notes (Signed)
PHARMACY CONSULT FOR:  Risk Assessment for Post-Discharge VTE Following Bariatric Surgery  Post-Discharge VTE Risk Assessment: This patient's probability of 30-day post-discharge VTE is increased due to the factors marked: x Sleeve gastrectomy   Liver disorder (transplant, cirrhosis, or nonalcoholic steatohepatitis)   Hx of VTE   Hemorrhage requiring transfusion   GI perforation, leak, or obstruction   ====================================================    Female    Age >/=60 years    BMI >/=50 kg/m2    CHF    Dyspnea at Rest    Paraplegia   x Non-gastric-band surgery    Operation Time >/=3 hr    Return to OR     Length of Stay >/= 3 d   Hypercoagulable condition   Significant venous stasis      Predicted probability of 30-day post-discharge VTE: 0.16%   Recommendation for Discharge: No pharmacologic prophylaxis post-discharge   Paula Clay is a 52 y.o. female who underwent laparoscopic sleeve gastrectomy on 03/05/2023.   Case start: 1230 Case end: 1339   Allergies  Allergen Reactions   Lisinopril Cough    Patient Measurements: Weight: 98.7 kg (217 lb 9.6 oz) Body mass index is 39.8 kg/m.  No results for input(s): "WBC", "HGB", "HCT", "PLT", "APTT", "CREATININE", "LABCREA", "CREAT24HRUR", "MG", "PHOS", "ALBUMIN", "PROT", "AST", "ALT", "ALKPHOS", "BILITOT", "BILIDIR", "IBILI" in the last 72 hours. Estimated Creatinine Clearance: 89.2 mL/min (by C-G formula based on SCr of 0.77 mg/dL).    Past Medical History:  Diagnosis Date   Actinic keratosis    Anxiety    Cancer (HCC)    skin ca   Complication of anesthesia    Depression    GERD (gastroesophageal reflux disease)    Goiter    Heart murmur    Hyperlipidemia    Hypertension    Pre-diabetes    Thyroid goiter      Medications Prior to Admission  Medication Sig Dispense Refill Last Dose   Ascorbic Acid (VITAMIN C PO) Take 1 tablet by mouth daily.   Past Week   b complex vitamins capsule Take  1 capsule by mouth daily.   Past Week   Biotin w/ Vitamins C & E (HAIR SKIN & NAILS GUMMIES PO) Take 2 tablets by mouth daily.   03/04/2023   buPROPion (WELLBUTRIN SR) 150 MG 12 hr tablet TAKE 2 TABLETS BY MOUTH ONCE EVERY MORNING AND 1 TAB ONCE EVERY EVENING FOR1 WEEK THEN TAKE 2 TABLETS BY MOUTH TWICE DAILY (Patient taking differently: Take 150 mg by mouth daily.) 120 tablet 0 03/05/2023 at 0700   busPIRone (BUSPAR) 15 MG tablet TAKE 1 TABLET BY MOUTH ONCE DAILY 90 tablet 0 03/05/2023 at 0700   CALCIUM-VITAMIN D PO Take 1 tablet by mouth daily.   Past Month   Cholecalciferol (VITAMIN D3 PO) Take 1 tablet by mouth daily.   Past Week   Coenzyme Q10 (CO Q10 PO) Take 1 capsule by mouth daily.   Past Week   escitalopram (LEXAPRO) 20 MG tablet TAKE 1 TABLET BY MOUTH ONCE DAILY 90 tablet 1 03/05/2023 at 0700   MAGNESIUM PO Take 1 tablet by mouth daily.   Past Week   Multiple Vitamins-Minerals (WOMENS MULTIVITAMIN PO) Take 1 tablet by mouth daily.   03/04/2023   rosuvastatin (CRESTOR) 40 MG tablet Take 40 mg by mouth every evening.   Past Week   Omega-3 Fatty Acids (FISH OIL PO) Take 1 capsule by mouth daily.   More than a month   traZODone (DESYREL) 50 MG tablet  Take 0.5-1 tablets (25-50 mg total) by mouth at bedtime as needed for sleep. (Patient not taking: Reported on 02/19/2023) 90 tablet 1 More than a month    Paula Clay 03/05/2023,3:21 PM

## 2023-03-05 NOTE — Interval H&P Note (Signed)
History and Physical Interval Note:  03/05/2023 11:38 AM  Paula Clay  has presented today for surgery, with the diagnosis of morbid obesity.  The various methods of treatment have been discussed with the patient and family. After consideration of risks, benefits and other options for treatment, the patient has consented to  Procedure(s): LAPAROSCOPIC SLEEVE GASTRECTOMY (N/A) UPPER GI ENDOSCOPY (N/A) HERNIA REPAIR HIATAL (N/A) as a surgical intervention.  The patient's history has been reviewed, patient examined, no change in status, stable for surgery.  I have reviewed the patient's chart and labs.  Questions were answered to the patient's satisfaction.     Gaynelle Adu

## 2023-03-05 NOTE — Anesthesia Procedure Notes (Signed)
Procedure Name: Intubation Date/Time: 03/05/2023 12:14 PM  Performed by: Pearson Grippe, CRNAPre-anesthesia Checklist: Patient identified, Emergency Drugs available, Suction available and Patient being monitored Patient Re-evaluated:Patient Re-evaluated prior to induction Oxygen Delivery Method: Circle system utilized Preoxygenation: Pre-oxygenation with 100% oxygen Induction Type: IV induction Ventilation: Mask ventilation without difficulty and Oral airway inserted - appropriate to patient size Laryngoscope Size: Glidescope and 3 Grade View: Grade I Tube type: Oral Tube size: 7.0 mm Number of attempts: 1 Airway Equipment and Method: Stylet, Oral airway and Video-laryngoscopy Placement Confirmation: ETT inserted through vocal cords under direct vision, positive ETCO2 and breath sounds checked- equal and bilateral Secured at: 21 cm Tube secured with: Tape Dental Injury: Teeth and Oropharynx as per pre-operative assessment  Comments: Elective glidescope d/t small mouth opening

## 2023-03-06 ENCOUNTER — Other Ambulatory Visit: Payer: Self-pay | Admitting: Family Medicine

## 2023-03-06 ENCOUNTER — Encounter (HOSPITAL_COMMUNITY): Payer: Self-pay | Admitting: General Surgery

## 2023-03-06 MED ORDER — BUPROPION HCL 75 MG PO TABS: 75.0000 mg | ORAL_TABLET | Freq: Four times a day (QID) | ORAL | 1 refills | Status: AC

## 2023-03-06 MED ORDER — PANTOPRAZOLE SODIUM 40 MG PO TBEC: 40.0000 mg | DELAYED_RELEASE_TABLET | Freq: Every day | ORAL | 0 refills | Status: AC

## 2023-03-06 MED ORDER — GABAPENTIN 100 MG PO CAPS: 200.0000 mg | ORAL_CAPSULE | Freq: Two times a day (BID) | ORAL | 0 refills | Status: AC

## 2023-03-06 MED ORDER — ONDANSETRON 4 MG PO TBDP: 4.0000 mg | ORAL_TABLET | Freq: Four times a day (QID) | ORAL | 0 refills | Status: AC | PRN

## 2023-03-06 MED ORDER — ACETAMINOPHEN 500 MG PO TABS: 1000.0000 mg | ORAL_TABLET | Freq: Three times a day (TID) | ORAL | Status: AC

## 2023-03-06 NOTE — Anesthesia Postprocedure Evaluation (Signed)
Anesthesia Post Note  Patient: Paula Clay  Procedure(s) Performed: LAPAROSCOPIC SLEEVE GASTRECTOMY UPPER GI ENDOSCOPY HERNIA REPAIR HIATAL     Patient location during evaluation: PACU Anesthesia Type: General Level of consciousness: awake and alert Pain management: pain level controlled Vital Signs Assessment: post-procedure vital signs reviewed and stable Respiratory status: spontaneous breathing, nonlabored ventilation, respiratory function stable and patient connected to nasal cannula oxygen Cardiovascular status: blood pressure returned to baseline and stable Postop Assessment: no apparent nausea or vomiting Anesthetic complications: no   No notable events documented.  Last Vitals:  Vitals:   03/06/23 0210 03/06/23 0540  BP: 118/72 134/70  Pulse: 70 73  Resp: 18 18  Temp: 36.5 C (!) 36.3 C  SpO2: 93% 92%    Last Pain:  Vitals:   03/06/23 0817  TempSrc:   PainSc: 1                  Collene Schlichter

## 2023-03-06 NOTE — Discharge Summary (Signed)
Physician Discharge Summary  Paula Clay QVZ:[id number] DOB: 09/30/1970 DOA: 03/05/2023  PCP: Paula Gang, FNP  Admit date: 03/05/2023 Discharge date: 03/06/23  Recommendations for Outpatient Follow-up:    Follow-up Information     Paula Adu, MD Follow up.   Specialty: General Surgery Contact information: [address] [place] [address] [phone]         Paula Just, PA-C Follow up.   Specialty: General Surgery Contact information: [address] [facility] [place] [address] [phone]                Discharge Diagnoses:  Principal Problem:   S/P laparoscopic sleeve gastrectomy with sliding hiatal hernia repair Obesity, Class II, BMI -39.9,  Prediabetes  Essential hypertension  Hyperlipidemia, unspecified hyperlipidemia type  Possible Sliding hiatal hernia   Surgical Procedure:   S/P laparoscopic sleeve gastrectomy with sliding hiatal hernia repair, upper endoscopy  Discharge Condition: Good Disposition: Home  Diet recommendation: Postoperative sleeve gastrectomy diet (liquids only)  Filed Weights   03/05/23 0922 03/05/23 1658  Weight: 98.7 kg 98.7 kg     Hospital Course:  The patient was admitted for a planned laparoscopic sleeve gastrectomy. Please see operative note. Preoperatively the patient was given 5000 units of subcutaneous heparin for DVT prophylaxis. Postoperative prophylactic heparin dosing was started on the evening of postoperative day 0. ERAS protocol was used. On the evening of postoperative day 0, the patient was started on water and ice chips. On postoperative day 1 the patient had no fever or tachycardia and was tolerating water in their diet was gradually advanced throughout the day. The patient was ambulating without difficulty. Their vital signs are stable without fever or tachycardia. Their hemoglobin had remained stable.  The patient had received discharge  instructions and counseling. They were deemed stable for discharge and had met discharge criteria  BP 132/67 (BP Location: Left Arm)   Pulse 69   Temp 98.2 F (36.8 C) (Oral)   Resp 16   Ht 5\' 2"  (1.575 m)   Wt 98.7 kg   SpO2 93%   BMI 39.80 kg/m   Gen: alert, NAD, non-toxic appearing Pupils: equal, no scleral icterus Pulm: Lungs clear to auscultation, symmetric chest rise CV: regular rate and rhythm Abd: soft, mild approp tender, nondistended.  No cellulitis. No incisional hernia Ext: no edema, no calf tenderness Skin: no rash, no jaundice   Discharge Instructions  Discharge Instructions     Ambulate hourly while awake   Complete by: As directed    Call MD for:  difficulty breathing, headache or visual disturbances   Complete by: As directed    Call MD for:  persistant dizziness or light-headedness   Complete by: As directed    Call MD for:  persistant nausea and vomiting   Complete by: As directed    Call MD for:  redness, tenderness, or signs of infection (pain, swelling, redness, odor or green/yellow discharge around incision site)   Complete by: As directed    Call MD for:  severe uncontrolled pain   Complete by: As directed    Call MD for:  temperature >101 F   Complete by: As directed    Diet bariatric full liquid   Complete by: As directed    Discharge instructions   Complete by: As directed    See bariatric discharge instructions   Incentive spirometry   Complete by: As directed    Perform hourly while awake  Allergies as of 03/06/2023       Reactions   Lisinopril Cough        Medication List     STOP taking these medications    buPROPion 150 MG 12 hr tablet Commonly known as: WELLBUTRIN SR Replaced by: buPROPion 75 MG tablet   traZODone 50 MG tablet Commonly known as: DESYREL       TAKE these medications    acetaminophen 500 MG tablet Commonly known as: TYLENOL Take 2 tablets (1,000 mg total) by mouth every 8 (eight) hours for  5 days.   b complex vitamins capsule Take 1 capsule by mouth daily.   buPROPion 75 MG tablet Commonly known as: WELLBUTRIN Take 1 tablet (75 mg total) by mouth 4 (four) times daily. Replaces: buPROPion 150 MG 12 hr tablet   busPIRone 15 MG tablet Commonly known as: BUSPAR TAKE 1 TABLET BY MOUTH ONCE DAILY   CALCIUM-VITAMIN D PO Take 1 tablet by mouth daily.   CO Q10 PO Take 1 capsule by mouth daily.   escitalopram 20 MG tablet Commonly known as: LEXAPRO TAKE 1 TABLET BY MOUTH ONCE DAILY   FISH OIL PO Take 1 capsule by mouth daily.   gabapentin 100 MG capsule Commonly known as: NEURONTIN Take 2 capsules (200 mg total) by mouth every 12 (twelve) hours.   HAIR SKIN & NAILS GUMMIES PO Take 2 tablets by mouth daily.   MAGNESIUM PO Take 1 tablet by mouth daily.   ondansetron 4 MG disintegrating tablet Commonly known as: ZOFRAN-ODT Take 1 tablet (4 mg total) by mouth every 6 (six) hours as needed for nausea or vomiting.   pantoprazole 40 MG tablet Commonly known as: PROTONIX Take 1 tablet (40 mg total) by mouth daily.   rosuvastatin 40 MG tablet Commonly known as: CRESTOR Take 40 mg by mouth every evening.   VITAMIN C PO Take 1 tablet by mouth daily.   VITAMIN D3 PO Take 1 tablet by mouth daily.   WOMENS MULTIVITAMIN PO Take 1 tablet by mouth daily.        Follow-up Information     Paula Adu, MD Follow up.   Specialty: General Surgery Contact information: [address] [place] [address] [phone]         Paula Just, PA-C Follow up.   Specialty: General Surgery Contact information: [address] [place] [address] [facility] [place] [address] [phone]                  The results of significant diagnostics from this hospitalization (including imaging, microbiology, ancillary and laboratory) are listed below for reference.    Significant Diagnostic Studies: ECHOCARDIOGRAM  COMPLETE  Result Date: 03/01/2023    ECHOCARDIOGRAM REPORT   Patient Name:   Paula Clay Date of Exam: 03/01/2023 Medical Rec #:  [id number]          Height:       62.0 in Accession #:    [id number]         Weight:       216.0 lb Date of Birth:  1971/03/05          BSA:          1.975 m Patient Age:    52 years           BP:           129/81 mmHg Patient Gender: F  HR:           77 bpm. Exam Location:  Outpatient Procedure: 2D Echo, Color Doppler and Cardiac Doppler Indications:    Murmur. Pre-op evaluation  History:        Patient has no prior history of Echocardiogram examinations.                 Risk Factors:Dyslipidemia.  Sonographer:    Delcie Roch RDCS Referring Phys: [phone] Ronan Dion  Sonographer Comments: Image acquisition challenging due to patient body habitus. IMPRESSIONS  1. Left ventricular ejection fraction, by estimation, is 60 to 65%. The left ventricle has normal function. The left ventricle has no regional wall motion abnormalities. Left ventricular diastolic parameters were normal.  2. Right ventricular systolic function is normal. The right ventricular size is normal. Tricuspid regurgitation signal is inadequate for assessing PA pressure.  3. A trivial pericardial effusion is present.  4. The mitral valve is normal in structure. Trivial mitral valve regurgitation. No evidence of mitral stenosis.  5. The aortic valve is tricuspid. Aortic valve regurgitation is not visualized. No aortic stenosis is present.  6. The inferior vena cava is normal in size with greater than 50% respiratory variability, suggesting right atrial pressure of 3 mmHg. FINDINGS  Left Ventricle: Left ventricular ejection fraction, by estimation, is 60 to 65%. The left ventricle has normal function. The left ventricle has no regional wall motion abnormalities. The left ventricular internal cavity size was normal in size. There is  no left ventricular hypertrophy. Left ventricular diastolic parameters  were normal. Right Ventricle: The right ventricular size is normal. No increase in right ventricular wall thickness. Right ventricular systolic function is normal. Tricuspid regurgitation signal is inadequate for assessing PA pressure. Left Atrium: Left atrial size was normal in size. Right Atrium: Right atrial size was normal in size. Pericardium: Trivial pericardial effusion is present. Mitral Valve: The mitral valve is normal in structure. Trivial mitral valve regurgitation. No evidence of mitral valve stenosis. Tricuspid Valve: The tricuspid valve is normal in structure. Tricuspid valve regurgitation is not demonstrated. Aortic Valve: The aortic valve is tricuspid. Aortic valve regurgitation is not visualized. No aortic stenosis is present. Pulmonic Valve: The pulmonic valve was not well visualized. Pulmonic valve regurgitation is trivial. Aorta: The aortic root is normal in size and structure. Venous: The inferior vena cava is normal in size with greater than 50% respiratory variability, suggesting right atrial pressure of 3 mmHg. IAS/Shunts: The interatrial septum was not well visualized.  LEFT VENTRICLE PLAX 2D LVIDd:         4.30 cm   Diastology LVIDs:         2.60 cm   LV e' medial:    8.05 cm/s LV PW:         0.90 cm   LV E/e' medial:  11.8 LV IVS:        0.90 cm   LV e' lateral:   9.03 cm/s LVOT diam:     1.90 cm   LV E/e' lateral: 10.5 LV SV:         73 LV SV Index:   37 LVOT Area:     2.84 cm  RIGHT VENTRICLE          IVC RV Basal diam:  2.90 cm  IVC diam: 1.90 cm TAPSE (M-mode): 2.1 cm LEFT ATRIUM             Index        RIGHT ATRIUM  Index LA diam:        3.90 cm 1.97 cm/m   RA Area:     14.60 cm LA Vol (A2C):   39.9 ml 20.20 ml/m  RA Volume:   35.80 ml  18.12 ml/m LA Vol (A4C):   65.1 ml 32.96 ml/m LA Biplane Vol: 52.6 ml 26.63 ml/m  AORTIC VALVE LVOT Vmax:   132.00 cm/s LVOT Vmean:  85.000 cm/s LVOT VTI:    0.257 m  AORTA Ao Root diam: 3.00 cm MITRAL VALVE MV Area (PHT): 4.49 cm     SHUNTS MV Decel Time: 169 msec    Systemic VTI:  0.26 m MV E velocity: 95.00 cm/s  Systemic Diam: 1.90 cm MV A velocity: 82.30 cm/s MV E/A ratio:  1.15 Epifanio Lesches MD Electronically signed by Epifanio Lesches MD Signature Date/Time: 03/01/2023/12:55:33 PM    Final     Labs: Basic Metabolic Panel: Recent Labs  Lab 03/06/23 0439  NA 137  K 4.8  CL 104  CO2 26  GLUCOSE 167*  BUN 9  CREATININE 0.56  CALCIUM 9.1   Liver Function Tests: Recent Labs  Lab 03/06/23 0439  AST 32  ALT 34  ALKPHOS 68  BILITOT 0.6  PROT 7.1  ALBUMIN 3.7    CBC: Recent Labs  Lab 03/05/23 1747 03/06/23 0439  WBC  --  14.5*  NEUTROABS  --  12.6*  HGB 14.4 14.6  HCT 43.6 44.2  MCV  --  95.3  PLT  --  231    CBG: No results for input(s): "GLUCAP" in the last 168 hours.  Principal Problem:   S/P laparoscopic sleeve gastrectomy   Time coordinating discharge: 15 min  Signed:  Atilano Ina, MD [facility] [phone] 03/06/2023, 9:49 AM

## 2023-03-06 NOTE — Progress Notes (Signed)
Patient alert and oriented, pain is controlled. Patient is tolerating fluids, advanced to protein shake today, patient is tolerating well. Reviewed Gastric sleeve/bypass discharge instructions with patient and patient is able to articulate understanding. Provided information on BELT program, Support Group, BSTOP-D, and [facility] outpatient pharmacy. Communicated general update of patient status to surgeon. All questions answered. 24hr fluid recall is 240 mL per hydration protocol, bariatric nurse coordinator to make follow-up phone call within one week.    Thank you,  Lubertha Basque, RN, MSN Bariatric Nurse Coordinator [phone] (office)

## 2023-03-06 NOTE — Progress Notes (Signed)
   03/06/23 1028  TOC Brief Assessment  Insurance and Status Reviewed  Patient has primary care physician Yes  Home environment has been reviewed Resides with spouse  Prior level of function: Independent at baseline  Prior/Current Home Services No current home services  Social Determinants of Health Reivew SDOH reviewed no interventions necessary  Readmission risk has been reviewed Yes  Transition of care needs no transition of care needs at this time

## 2023-03-07 NOTE — Telephone Encounter (Signed)
Unable to refill per protocol, no longer under prescriber care.  Requested Prescriptions  Pending Prescriptions Disp Refills   busPIRone (BUSPAR) 15 MG tablet [Pharmacy Med Name: BUSPIRONE HCL 15 MG TAB] 90 tablet 0    Sig: TAKE 1 TABLET BY MOUTH ONCE DAILY     Psychiatry: Anxiolytics/Hypnotics - Non-controlled Failed - 03/06/2023  1:04 PM      Failed - Valid encounter within last 12 months    Recent Outpatient Visits           1 year ago Morbid obesity (HCC)   [place] [facility] [place], [name], DO   1 year ago Morbid obesity [facility])   [place] [facility] [place], [name], DO   1 year ago Routine general medical examination at a health care facility   [facility] [place] P, DO   2 years ago Mild episode of recurrent major depressive disorder [facility])   [place] [facility] [place], [name], DO   2 years ago Routine general medical examination at a health care facility   [facility], [name], DO

## 2023-03-14 ENCOUNTER — Telehealth (HOSPITAL_COMMUNITY): Payer: Self-pay | Admitting: *Deleted

## 2023-03-14 NOTE — Telephone Encounter (Signed)
1. Tell me about your pain and pain management?     Pt denies any pain.     2. Let's talk about fluid intake. How much total fluid are you taking in?   Pt states that s/he is getting in at least 35-40oz of fluid including protein shakes, bottled water,   Pt instructed to assess status and suggestions daily utilizing Hydration Action Plan on discharge folder and to call [facility] if in the "red zone".    3. How much protein have you taken in the last day?     Pt states she is meeting the goal of 60g of protein each day with the protein shakes and getting in a extra premier protein shake daily for a total of 90g    4. Have you had nausea? Tell me about when you have experienced nausea and what you did to help?   Pt denies nausea.   5. Tell me what your incisions look like?   "Incisions look fine". Pt denies a fever, chills. Pt states incisions are not swollen, open, or draining. Pt encouraged to call [facility] if incisions change.     6. Have you been passing gas? BM?   Pt states that they have had a BM. Pt instructed to take either Miralax or MoM as instructed per "Gastric Bypass/Sleeve Discharge Home Care Instructions". Pt to call surgeon's office if not able to have BM with medication.    7. If a problem or question were to arise who would you call? Do you know contact numbers for [facility], [facility], and [facility]?   Pt knows to call [facility] for surgical, [facility] for nutrition, and [facility] for non-urgent questions or concerns. Pt denies dehydration symptoms. Pt can describe s/sx of dehydration.   8. How has the walking going?   Pt states s/he is walking around and able to be active without difficulty.   9. Are you still using your incentive spirometer? If so, how often?   Pt states that she has not been doing the I.S. Pt encouraged to use incentive spirometer, at least 10x every hour while awake until s/he sees the surgeon.   10. How are your vitamins and calcium going? How are you taking them?    Pt states  that s/he is taking his/her supplements and vitamins without difficulty.

## 2023-03-19 ENCOUNTER — Encounter: Payer: BC Managed Care – PPO | Attending: General Surgery | Admitting: Dietician

## 2023-03-19 ENCOUNTER — Encounter: Payer: Self-pay | Admitting: Dietician

## 2023-03-19 VITALS — Ht 62.0 in | Wt 208.5 lb

## 2023-03-19 DIAGNOSIS — E669 Obesity, unspecified: Secondary | ICD-10-CM | POA: Diagnosis present

## 2023-03-19 NOTE — Progress Notes (Signed)
2 Week Post-Operative Nutrition Class   Patient was seen on 03/19/2023 for Post-Operative Nutrition education at [facility]    Surgery date: 03/05/2023 Surgery type: Sleeve Gastrectomy  Anthropometrics  Start weight at [facility]: 215.3 lbs (date: 01/24/2023) Height: 62 in Weight today: 208.5 lb   Clinical   Pharmacotherapy: History of weight loss medication used: several different injectables  Medical hx: hypercholesterolemia, obesity Medications: magnesium, calcium, B complex, fish oil, Vit C, Vit D, rosuvastatin, bupropion, lexapro  Labs: A1c 5.7, chol 261, triglycerides 175, LDL 173, glucose 100 Notable signs/symptoms: none noted Any previous deficiencies? No Bowel Habits: Every day to every other day no complaints   Body Composition Scale 03/19/2023  Current Body Weight 208.5  Total Body Fat % 44.0  Visceral Fat 15  Fat-Free Mass % 55.9   Total Body Water % 42.4  Muscle-Mass lbs 27.8  BMI 37.9  Body Fat Displacement          Torso  lbs 56.8         Left Leg  lbs 11.3         Right Leg  lbs 11.3         Left Arm  lbs 5.6         Right Arm  lbs 5.6     The following the learning objectives were met by the patient during this course: Identifies Soft Prepped Plan Advancement Guide  Identifies Soft, High Proteins (Phase 1), beginning 2 weeks post-operatively to 3 weeks post-operatively Identifies Additional Soft High Proteins, soft non-starchy vegetables, fruits and starches (Phase 2), beginning 3 weeks post-operatively to 3 months post-operatively Identifies appropriate sources of fluids, proteins, vegetables, fruits and starches Identifies appropriate fat sources and healthy verses unhealthy fat types   States protein, vegetable, fruit and starch recommendations and appropriate sources post-operatively Identifies the need for appropriate texture modifications, mastication, and bite sizes when consuming solids Identifies appropriate fat  consumption and sources Identifies appropriate multivitamin and calcium sources post-operatively Describes the need for physical activity post-operatively and will follow MD recommendations States when to call healthcare provider regarding medication questions or post-operative complications   Handouts given during class include: Soft Prepped Plan Advancement Guide   Follow-Up Plan: Patient will follow-up at [facility] in 10 weeks for 3 month post-op nutrition visit for diet advancement per MD.

## 2023-03-28 ENCOUNTER — Telehealth: Payer: Self-pay | Admitting: Dietician

## 2023-03-28 NOTE — Telephone Encounter (Signed)
RD called pt to verify fluid intake once starting soft, solid proteins 2 week post-bariatric surgery.   Daily Fluid intake: 40 oz. Daily Protein intake: 60 grams (with supplement with the protein shakes) Bowel Habits: starting to have constipation; pt states she is planning to use Miralax today.  Concerns/issues: pt states she feels good, and is walking, stating she has good energy.

## 2023-04-15 ENCOUNTER — Other Ambulatory Visit: Payer: Self-pay | Admitting: Family Medicine

## 2023-04-15 DIAGNOSIS — Z1231 Encounter for screening mammogram for malignant neoplasm of breast: Secondary | ICD-10-CM

## 2023-05-27 ENCOUNTER — Ambulatory Visit
Admission: RE | Admit: 2023-05-27 | Discharge: 2023-05-27 | Disposition: A | Discharge status: Home / Self Care | Payer: BC Managed Care – PPO | Source: Ambulatory Visit | Attending: Family Medicine | Admitting: Family Medicine

## 2023-05-27 DIAGNOSIS — Z1231 Encounter for screening mammogram for malignant neoplasm of breast: Secondary | ICD-10-CM | POA: Diagnosis present

## 2023-06-06 ENCOUNTER — Ambulatory Visit: Payer: BC Managed Care – PPO | Admitting: Dietician

## 2023-06-24 ENCOUNTER — Encounter: Payer: Self-pay | Admitting: Dietician

## 2023-06-24 ENCOUNTER — Encounter: Payer: BC Managed Care – PPO | Attending: General Surgery | Admitting: Dietician

## 2023-06-24 VITALS — Ht 62.0 in | Wt 190.8 lb

## 2023-06-24 DIAGNOSIS — E669 Obesity, unspecified: Secondary | ICD-10-CM | POA: Diagnosis present

## 2023-06-24 NOTE — Progress Notes (Signed)
Bariatric Nutrition Follow-Up Visit Medical Nutrition Therapy  Appt Start Time: 1:54   End Time: 2:25  Surgery date: 03/05/2023 Surgery type: Sleeve Gastrectomy  NUTRITION ASSESSMENT  Anthropometrics  Start weight at [facility]: 215.3 lbs (date: 01/24/2023) Height: 62 in Weight today: 190.8 lb   Clinical  Medical hx: hypercholesterolemia, obesity Medications: magnesium, calcium, B complex, fish oil, Vit C, Vit D, rosuvastatin, bupropion, lexapro  Labs: A1c 5.7, chol 261, triglycerides 175, LDL 173, glucose 100 Notable signs/symptoms: none noted Any previous deficiencies? No Bowel Habits: Every day to every other day no complaints   Body Composition Scale 03/19/2023 06/24/2023  Current Body Weight 208.5 190.8  Total Body Fat % 44.0 41.6  Visceral Fat 15 13  Fat-Free Mass % 55.9 58.3   Total Body Water % 42.4 43.6  Muscle-Mass lbs 27.8 27.7  BMI 37.9 34.7  Body Fat Displacement           Torso  lbs 56.8 49.1         Left Leg  lbs 11.3 9.8         Right Leg  lbs 11.3 9.8         Left Arm  lbs 5.6 4.9         Right Arm  lbs 5.6 4.9     Lifestyle & Dietary Hx Pt states she started drinking hot tea to help with fluid intake, stating she doesn't get a goo   Estimated daily fluid intake: 32 oz Estimated daily protein intake: 60+ g Supplements: multivitamin and calcium Current average weekly physical activity: walking on the job; gym 2-3 times a week (treadmill or elliptical for 30 minutes)   24-Hr Dietary Recall First Meal: greek yogurt, low sugar granola Snack:   Second Meal: tuna or humus and veggies or cottage cheese Snack:   Third Meal: part of a baked potato or chicken and broccoli casserole (light mayo and 2% shredded cheese) or protein shake Snack: sugar free mint and cup of tea (truvia sweetener) Beverages: water, hot tea, Gatorade zero  Post-Op Goals/ Signs/ Symptoms Using straws: yes Drinking while eating: no Chewing/swallowing difficulties: no Changes in  vision: no Changes to mood/headaches: no Hair loss/changes to skin/nails: no Difficulty focusing/concentrating: no Sweating: no Limb weakness: no Dizziness/lightheadedness: no Palpitations: no  Carbonated/caffeinated beverages: no N/V/D/C/Gas: constipation (using Miralax) Abdominal pain: no Dumping syndrome: no    NUTRITION DIAGNOSIS  Overweight/obesity (-3.3) related to past poor dietary habits and physical inactivity as evidenced by completed bariatric surgery and following dietary guidelines for continued weight loss and healthy nutrition status.     NUTRITION INTERVENTION Nutrition counseling (C-1) and education (E-2) to facilitate bariatric surgery goals, including: Diet advancement to the standard prep plan The importance of consuming adequate calories as well as certain nutrients daily due to the body's need for essential vitamins, minerals, and fats The importance of daily physical activity and to reach a goal of at least 150 minutes of moderate to vigorous physical activity weekly (or as directed by their physician) due to benefits such as increased musculature and improved lab values The importance of intuitive eating specifically learning hunger-satiety cues and understanding the importance of learning a new body: The importance of mindful eating to avoid grazing behaviors   Goals Increase fluids; aim for 64 oz per day Increase physical activity; aim for 5 days per week Aim for 2 or more servings of non-starchy vegetables per day  Handouts Provided Include  Standard Prep Plan advancement guide  Learning Style &  Readiness for Change Teaching method utilized: Visual & Auditory  Demonstrated degree of understanding via: Teach Back  Readiness Level: ready Barriers to learning/adherence to lifestyle change: nothing identified  RD's Notes for Next Visit Assess adherence to pt chosen goals  MONITORING & EVALUATION Dietary intake, weekly physical activity, body  weight.  Next Steps Patient is to follow-up in 3 months for 6 month post-op follow-up.

## 2023-10-08 ENCOUNTER — Ambulatory Visit: Payer: BC Managed Care – PPO | Admitting: Dietician

## 2024-04-15 ENCOUNTER — Other Ambulatory Visit: Payer: Self-pay | Admitting: Family Medicine

## 2024-04-15 DIAGNOSIS — Z1231 Encounter for screening mammogram for malignant neoplasm of breast: Secondary | ICD-10-CM

## 2024-05-27 ENCOUNTER — Ambulatory Visit
Admission: RE | Admit: 2024-05-27 | Discharge: 2024-05-27 | Disposition: A | Discharge status: Home / Self Care | Payer: Self-pay | Source: Ambulatory Visit | Attending: Family Medicine | Admitting: Family Medicine

## 2024-05-27 DIAGNOSIS — Z1231 Encounter for screening mammogram for malignant neoplasm of breast: Secondary | ICD-10-CM | POA: Diagnosis present
